# Patient Record
Sex: Male | Born: 1950 | Race: White | Hispanic: No | Marital: Single | Smoking: Current every day smoker
Health system: Southern US, Community
[De-identification: ages and names within clinical notes are randomized; demographics above are authoritative.]

## PROBLEM LIST (undated history)

## (undated) DIAGNOSIS — R569 Unspecified convulsions: Secondary | ICD-10-CM

## (undated) DIAGNOSIS — K219 Gastro-esophageal reflux disease without esophagitis: Secondary | ICD-10-CM

## (undated) HISTORY — PX: HERNIA REPAIR: SHX51

---

## 2012-03-07 ENCOUNTER — Inpatient Hospital Stay: Payer: Self-pay | Admitting: Unknown Physician Specialty

## 2012-03-07 LAB — URINALYSIS, COMPLETE
Bacteria: NONE SEEN
Glucose,UR: NEGATIVE mg/dL (ref 0–75)
Ketone: NEGATIVE
Leukocyte Esterase: NEGATIVE
Ph: 7 (ref 4.5–8.0)
RBC,UR: <1 /HPF (ref 0–5)
Specific Gravity: 1.003 (ref 1.003–1.030)

## 2012-03-07 LAB — ETHANOL
Ethanol %: 0.282 % — ABNORMAL HIGH (ref 0.000–0.080)
Ethanol: 282 mg/dL

## 2012-03-07 LAB — COMPREHENSIVE METABOLIC PANEL
Albumin: 4.2 g/dL (ref 3.4–5.0)
Anion Gap: 10 (ref 7–16)
BUN: 7 mg/dL (ref 7–18)
Chloride: 102 mmol/L (ref 98–107)
Co2: 27 mmol/L (ref 21–32)
EGFR (Non-African Amer.): >60
Osmolality: 276 (ref 275–301)
SGOT(AST): 171 U/L — ABNORMAL HIGH (ref 15–37)
SGPT (ALT): 104 U/L — ABNORMAL HIGH
Sodium: 139 mmol/L (ref 136–145)
Total Protein: 7.9 g/dL (ref 6.4–8.2)

## 2012-03-07 LAB — DRUG SCREEN, URINE
Amphetamines, Ur Screen: NEGATIVE (ref ?–1000)
Barbiturates, Ur Screen: NEGATIVE (ref ?–200)
MDMA (Ecstasy)Ur Screen: NEGATIVE (ref ?–500)
Methadone, Ur Screen: NEGATIVE (ref ?–300)
Opiate, Ur Screen: NEGATIVE (ref ?–300)
Phencyclidine (PCP) Ur S: NEGATIVE (ref ?–25)
Tricyclic, Ur Screen: NEGATIVE (ref ?–1000)

## 2012-03-07 LAB — CBC
MCHC: 35.1 g/dL (ref 32.0–36.0)
MCV: 97 fL (ref 80–100)
RBC: 4.34 10*6/uL — ABNORMAL LOW (ref 4.40–5.90)
WBC: 6.9 10*3/uL (ref 3.8–10.6)

## 2012-03-07 LAB — TSH: Thyroid Stimulating Horm: 3.11 u[IU]/mL

## 2012-03-31 ENCOUNTER — Inpatient Hospital Stay: Payer: Self-pay | Admitting: Internal Medicine

## 2012-03-31 LAB — CBC
HCT: 50 % (ref 40.0–52.0)
HGB: 17.2 g/dL (ref 13.0–18.0)
MCH: 32.3 pg (ref 26.0–34.0)
MCHC: 34.4 g/dL (ref 32.0–36.0)
MCV: 94 fL (ref 80–100)
RBC: 5.33 10*6/uL (ref 4.40–5.90)
RDW: 12.4 % (ref 11.5–14.5)
WBC: 11.9 10*3/uL — ABNORMAL HIGH (ref 3.8–10.6)

## 2012-03-31 LAB — COMPREHENSIVE METABOLIC PANEL
Albumin: 4.3 g/dL (ref 3.4–5.0)
Alkaline Phosphatase: 90 U/L (ref 50–136)
BUN: 19 mg/dL — ABNORMAL HIGH (ref 7–18)
Chloride: 102 mmol/L (ref 98–107)
Co2: 19 mmol/L — ABNORMAL LOW (ref 21–32)
Creatinine: 1.02 mg/dL (ref 0.60–1.30)
EGFR (African American): >60
Glucose: 103 mg/dL — ABNORMAL HIGH (ref 65–99)
Osmolality: 278 (ref 275–301)
SGOT(AST): 76 U/L — ABNORMAL HIGH (ref 15–37)
SGPT (ALT): 36 U/L
Sodium: 138 mmol/L (ref 136–145)
Total Protein: 8.2 g/dL (ref 6.4–8.2)

## 2012-03-31 LAB — ETHANOL: Ethanol %: 0.299 % — ABNORMAL HIGH (ref 0.000–0.080)

## 2012-03-31 LAB — URINALYSIS, COMPLETE
Glucose,UR: NEGATIVE mg/dL (ref 0–75)
Hyaline Cast: 1
Nitrite: NEGATIVE
Ph: 6 (ref 4.5–8.0)
Protein: 100
Specific Gravity: 1.026 (ref 1.003–1.030)
WBC UR: 1 /HPF (ref 0–5)

## 2012-03-31 LAB — DRUG SCREEN, URINE
Amphetamines, Ur Screen: NEGATIVE (ref ?–1000)
Cocaine Metabolite,Ur ~~LOC~~: NEGATIVE (ref ?–300)
Methadone, Ur Screen: NEGATIVE (ref ?–300)
Phencyclidine (PCP) Ur S: NEGATIVE (ref ?–25)

## 2012-03-31 LAB — TSH: Thyroid Stimulating Horm: 1.43 u[IU]/mL

## 2012-04-01 DIAGNOSIS — I4891 Unspecified atrial fibrillation: Secondary | ICD-10-CM

## 2012-04-01 LAB — BASIC METABOLIC PANEL
BUN: 20 mg/dL — ABNORMAL HIGH (ref 7–18)
Calcium, Total: 8.3 mg/dL — ABNORMAL LOW (ref 8.5–10.1)
Chloride: 101 mmol/L (ref 98–107)
EGFR (African American): >60
Osmolality: 277 (ref 275–301)
Potassium: 3.6 mmol/L (ref 3.5–5.1)
Sodium: 137 mmol/L (ref 136–145)

## 2012-04-01 LAB — LIPID PANEL
Cholesterol: 113 mg/dL (ref 0–200)
HDL Cholesterol: 41 mg/dL (ref 40–60)
Ldl Cholesterol, Calc: 24 mg/dL (ref 0–100)
Triglycerides: 241 mg/dL — ABNORMAL HIGH (ref 0–200)
VLDL Cholesterol, Calc: 48 mg/dL — ABNORMAL HIGH (ref 5–40)

## 2012-04-01 LAB — MAGNESIUM: Magnesium: 2 mg/dL

## 2012-04-02 ENCOUNTER — Telehealth: Payer: Self-pay

## 2012-04-02 DIAGNOSIS — I4891 Unspecified atrial fibrillation: Secondary | ICD-10-CM

## 2012-04-02 NOTE — Telephone Encounter (Signed)
LMTCB re: TCM hosp d/c f/u attempt

## 2012-04-02 NOTE — Telephone Encounter (Signed)
Message copied by Marcelle Overlie on Mon Apr 02, 2012  4:45 PM ------      Message from: [place] E      Created: Mon Apr 02, 2012  2:36 PM      Regarding: TRANSITIONAL CARE PT       NEEDS F/U 14 DAYS

## 2012-04-03 NOTE — Telephone Encounter (Signed)
LMTCB TCM attempt #2 

## 2012-04-10 LAB — ETHANOL: Ethanol %: 0.37 % (ref 0.000–0.080)

## 2012-04-10 LAB — URINALYSIS, COMPLETE
Blood: NEGATIVE
Glucose,UR: NEGATIVE mg/dL (ref 0–75)
Ketone: NEGATIVE
Ph: 7 (ref 4.5–8.0)
Protein: NEGATIVE
Squamous Epithelial: NONE SEEN

## 2012-04-10 LAB — COMPREHENSIVE METABOLIC PANEL
Calcium, Total: 8.6 mg/dL (ref 8.5–10.1)
Chloride: 106 mmol/L (ref 98–107)
Co2: 29 mmol/L (ref 21–32)
EGFR (African American): >60
EGFR (Non-African Amer.): >60
SGOT(AST): 60 U/L — ABNORMAL HIGH (ref 15–37)
SGPT (ALT): 31 U/L

## 2012-04-10 LAB — DRUG SCREEN, URINE
Amphetamines, Ur Screen: NEGATIVE (ref ?–1000)
Barbiturates, Ur Screen: NEGATIVE (ref ?–200)
Benzodiazepine, Ur Scrn: NEGATIVE (ref ?–200)
Cocaine Metabolite,Ur ~~LOC~~: NEGATIVE (ref ?–300)
Methadone, Ur Screen: NEGATIVE (ref ?–300)
Opiate, Ur Screen: NEGATIVE (ref ?–300)
Tricyclic, Ur Screen: NEGATIVE (ref ?–1000)

## 2012-04-10 LAB — CBC
HCT: 40 % (ref 40.0–52.0)
MCH: 32.8 pg (ref 26.0–34.0)
MCV: 95 fL (ref 80–100)
Platelet: 195 10*3/uL (ref 150–440)
RBC: 4.21 10*6/uL — ABNORMAL LOW (ref 4.40–5.90)
WBC: 6.8 10*3/uL (ref 3.8–10.6)

## 2012-04-10 LAB — LIPASE, BLOOD: Lipase: 593 U/L — ABNORMAL HIGH (ref 73–393)

## 2012-04-11 ENCOUNTER — Inpatient Hospital Stay: Payer: Self-pay | Admitting: Psychiatry

## 2012-04-11 LAB — ETHANOL
Ethanol %: 0.154 % — ABNORMAL HIGH (ref 0.000–0.080)
Ethanol: 154 mg/dL

## 2012-04-12 LAB — BEHAVIORAL MEDICINE 1 PANEL
Albumin: 3.5 g/dL (ref 3.4–5.0)
Anion Gap: 13 (ref 7–16)
BUN: 10 mg/dL (ref 7–18)
Basophil %: 0 %
Bilirubin,Total: 1.4 mg/dL — ABNORMAL HIGH (ref 0.2–1.0)
Calcium, Total: 8.4 mg/dL — ABNORMAL LOW (ref 8.5–10.1)
Creatinine: 0.65 mg/dL (ref 0.60–1.30)
Eosinophil #: 0.1 10*3/uL (ref 0.0–0.7)
Eosinophil %: 2.6 %
Glucose: 95 mg/dL (ref 65–99)
HCT: 36.1 % — ABNORMAL LOW (ref 40.0–52.0)
HGB: 12.5 g/dL — ABNORMAL LOW (ref 13.0–18.0)
Lymphocyte %: 40.8 %
MCH: 33.1 pg (ref 26.0–34.0)
Monocyte #: 0.4 x10 3/mm (ref 0.2–1.0)
Monocyte %: 8.7 %
Neutrophil #: 2.2 10*3/uL (ref 1.4–6.5)
Neutrophil %: 47.9 %
Potassium: 3.4 mmol/L — ABNORMAL LOW (ref 3.5–5.1)
RBC: 3.79 10*6/uL — ABNORMAL LOW (ref 4.40–5.90)
SGOT(AST): 46 U/L — ABNORMAL HIGH (ref 15–37)
SGPT (ALT): 23 U/L
Total Protein: 6.8 g/dL (ref 6.4–8.2)

## 2012-04-15 LAB — COMPREHENSIVE METABOLIC PANEL
Albumin: 3.7 g/dL (ref 3.4–5.0)
Alkaline Phosphatase: 88 U/L (ref 50–136)
BUN: 11 mg/dL (ref 7–18)
Bilirubin,Total: 0.4 mg/dL (ref 0.2–1.0)
Chloride: 105 mmol/L (ref 98–107)
Co2: 29 mmol/L (ref 21–32)
Creatinine: 0.91 mg/dL (ref 0.60–1.30)
Osmolality: 286 (ref 275–301)
Potassium: 3.6 mmol/L (ref 3.5–5.1)
Sodium: 142 mmol/L (ref 136–145)

## 2012-04-15 LAB — TSH: Thyroid Stimulating Horm: 4.72 u[IU]/mL — ABNORMAL HIGH

## 2012-04-17 LAB — T4, FREE: Free Thyroxine: 0.99 ng/dL (ref 0.76–1.46)

## 2012-04-25 ENCOUNTER — Inpatient Hospital Stay: Payer: Self-pay | Admitting: Psychiatry

## 2012-04-25 LAB — COMPREHENSIVE METABOLIC PANEL
Albumin: 4.3 g/dL (ref 3.4–5.0)
Anion Gap: 12 (ref 7–16)
Bilirubin,Total: 0.4 mg/dL (ref 0.2–1.0)
Chloride: 104 mmol/L (ref 98–107)
Co2: 24 mmol/L (ref 21–32)
Creatinine: 0.84 mg/dL (ref 0.60–1.30)
EGFR (African American): >60
EGFR (Non-African Amer.): >60
Osmolality: 281 (ref 275–301)
Potassium: 3.7 mmol/L (ref 3.5–5.1)
Sodium: 140 mmol/L (ref 136–145)

## 2012-04-25 LAB — URINALYSIS, COMPLETE
Bacteria: NONE SEEN
Blood: NEGATIVE
Ketone: NEGATIVE
Protein: NEGATIVE
Specific Gravity: 1.011 (ref 1.003–1.030)
WBC UR: NONE SEEN /HPF (ref 0–5)

## 2012-04-25 LAB — DRUG SCREEN, URINE
Amphetamines, Ur Screen: NEGATIVE (ref ?–1000)
Benzodiazepine, Ur Scrn: NEGATIVE (ref ?–200)
Methadone, Ur Screen: NEGATIVE (ref ?–300)
Phencyclidine (PCP) Ur S: NEGATIVE (ref ?–25)
Tricyclic, Ur Screen: NEGATIVE (ref ?–1000)

## 2012-04-25 LAB — CBC
HCT: 48.3 % (ref 40.0–52.0)
HGB: 16.4 g/dL (ref 13.0–18.0)
MCH: 32.2 pg (ref 26.0–34.0)
MCV: 95 fL (ref 80–100)
RBC: 5.08 10*6/uL (ref 4.40–5.90)
RDW: 13.1 % (ref 11.5–14.5)

## 2012-04-25 LAB — ETHANOL: Ethanol: 321 mg/dL

## 2012-04-25 LAB — TSH: Thyroid Stimulating Horm: 2.05 u[IU]/mL

## 2012-04-25 LAB — AMMONIA: Ammonia, Plasma: <25 mcmol/L (ref 11–32)

## 2012-08-20 LAB — CBC
HCT: 42.7 % (ref 40.0–52.0)
MCHC: 34.6 g/dL (ref 32.0–36.0)
MCV: 93 fL (ref 80–100)
RDW: 15.2 % — ABNORMAL HIGH (ref 11.5–14.5)

## 2012-08-20 LAB — DRUG SCREEN, URINE

## 2012-08-20 LAB — COMPREHENSIVE METABOLIC PANEL
Albumin: 3.9 g/dL (ref 3.4–5.0)
Anion Gap: 9 (ref 7–16)
BUN: 8 mg/dL (ref 7–18)
Bilirubin,Total: 0.4 mg/dL (ref 0.2–1.0)
Chloride: 105 mmol/L (ref 98–107)
EGFR (African American): >60
Glucose: 117 mg/dL — ABNORMAL HIGH (ref 65–99)
Osmolality: 277 (ref 275–301)
Potassium: 3.7 mmol/L (ref 3.5–5.1)
SGOT(AST): 67 U/L — ABNORMAL HIGH (ref 15–37)
Sodium: 139 mmol/L (ref 136–145)
Total Protein: 8.1 g/dL (ref 6.4–8.2)

## 2012-08-20 LAB — ETHANOL
Ethanol %: 0.34 % (ref 0.000–0.080)
Ethanol: 340 mg/dL

## 2012-08-20 LAB — URINALYSIS, COMPLETE
Nitrite: NEGATIVE
Protein: NEGATIVE
RBC,UR: 1 /HPF (ref 0–5)
Squamous Epithelial: NONE SEEN
WBC UR: 1 /HPF (ref 0–5)

## 2012-08-21 ENCOUNTER — Inpatient Hospital Stay: Payer: Self-pay | Admitting: Psychiatry

## 2013-04-15 IMAGING — CR DG CHEST 1V PORT
1 series · 2 of 2 positions shown · non-contrast
Comparison: none

REASON FOR EXAM: weakness
COMMENTS:

PROCEDURE:     DXR - DXR PORTABLE CHEST SINGLE VIEW  - April 10, 2012 [DATE]
RESULT:     Comparison: None

[Series 1: portable · 0.17mm/px · 2 of 2 slices shown]
[im 1/2]
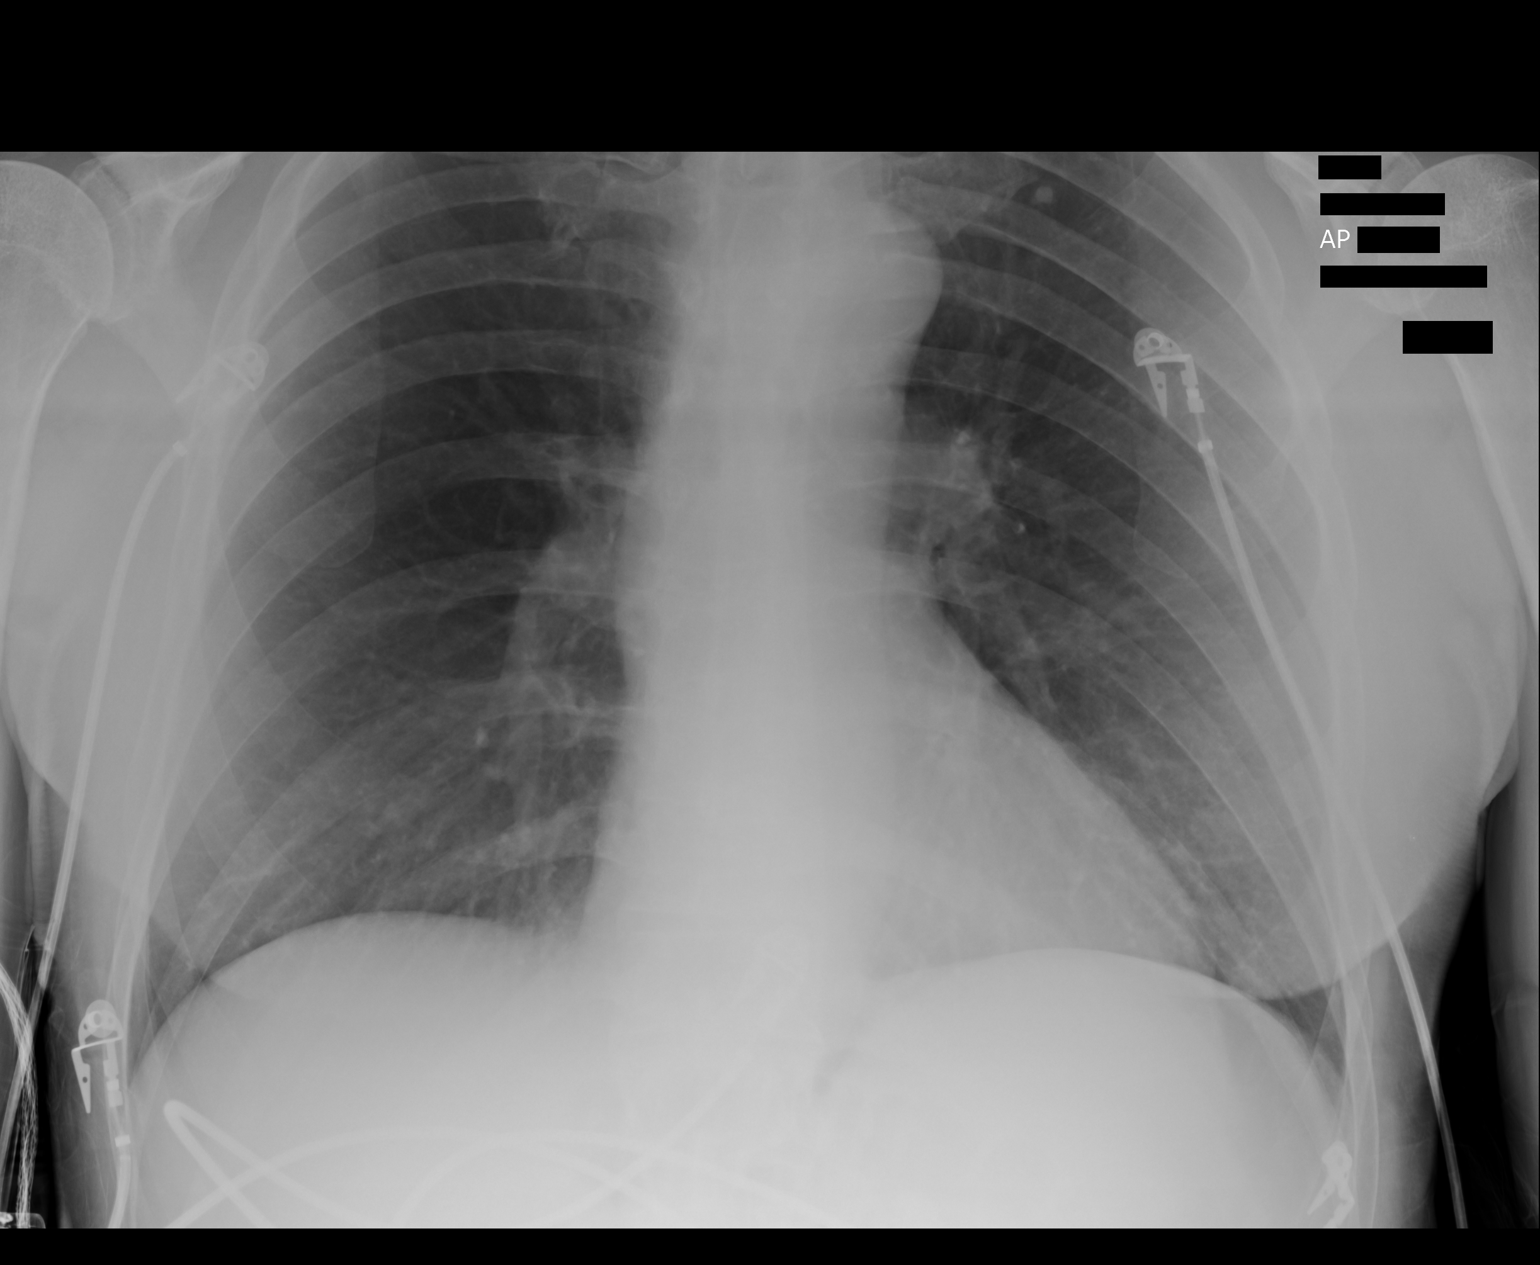
[im 2/2]
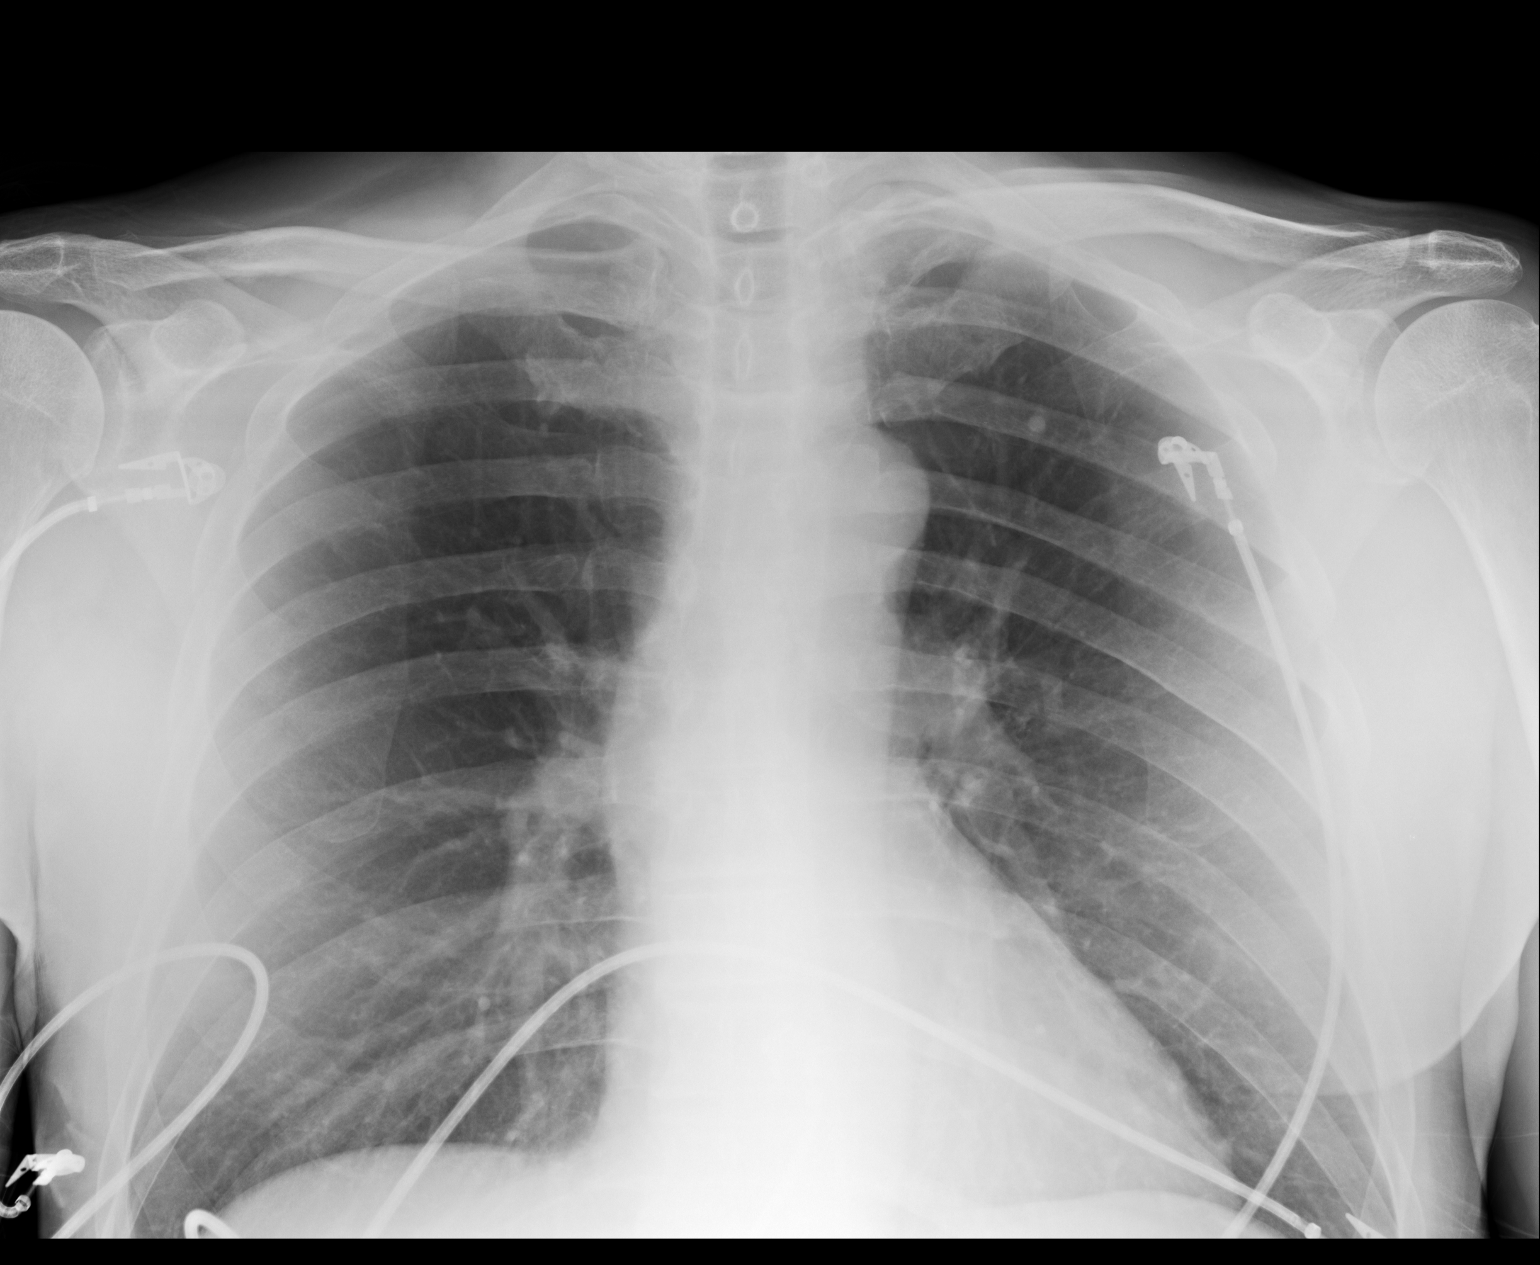

[2 of 2 positions shown; findings below may reference images not displayed]

FINDINGS: Single portable AP chest radiograph is provided.  There is no focal
parenchymal opacity, pleural effusion, or pneumothorax. Normal
cardiomediastinal silhouette. The osseous structures are unremarkable.
IMPRESSION: No acute disease of the che[REDACTED]

## 2013-04-15 IMAGING — CT CT HEAD WITHOUT CONTRAST
2 series · 16 of 30 positions shown, 20 images · non-contrast
Comparison: none

REASON FOR EXAM: ataxia
COMMENTS:

PROCEDURE:     CT  - CT HEAD WITHOUT CONTRAST  - April 10, 2012  [DATE]
RESULT:     History: Ataxia.
Comparison Study: No prior.

[Series 2: without · axial · non-contrast · 0.47mm/px · z∈[-148,-18]mm · 13 of 32 slices shown, 17 images]
[im 3/32  brain]
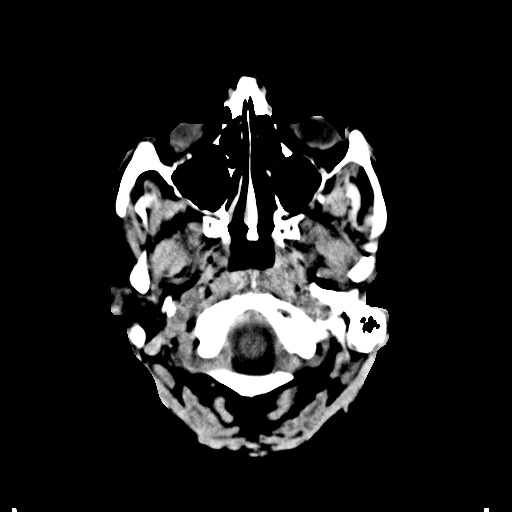
[im 3/32  bone]
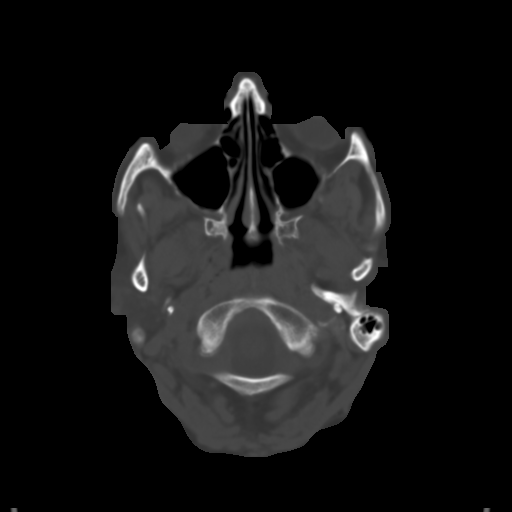
[im 5/32  brain]
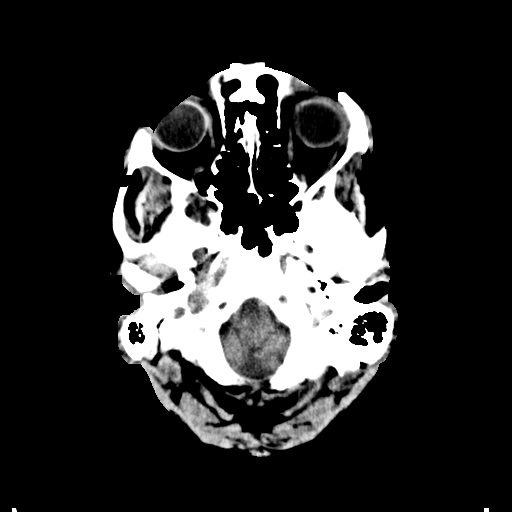
[im 7/32  brain]
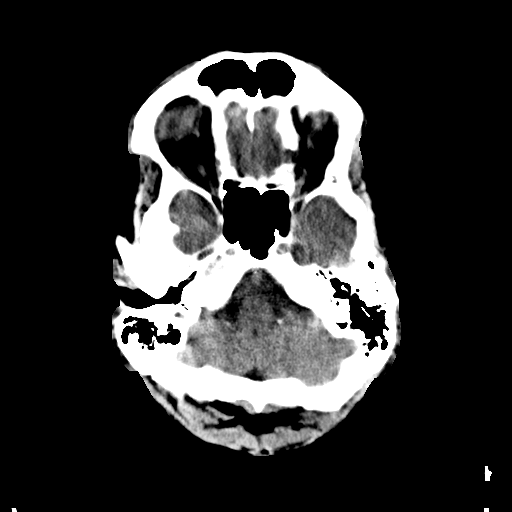
[im 9/32  brain]
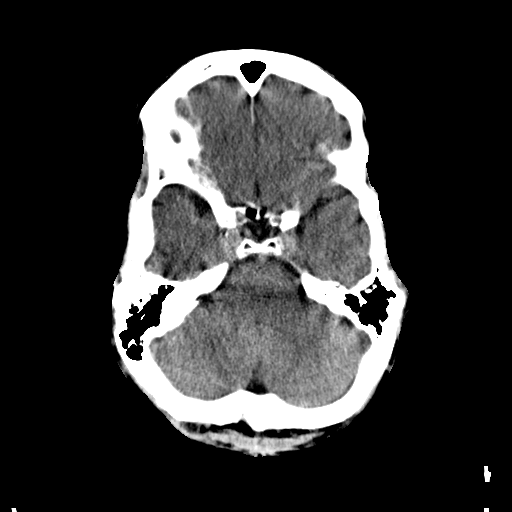
[im 12/32  brain]
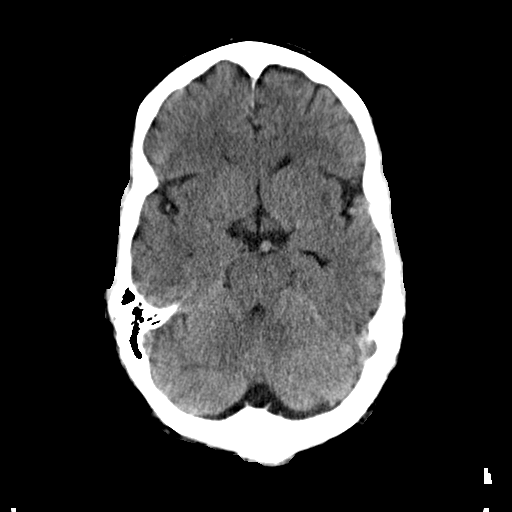
[im 12/32  bone]
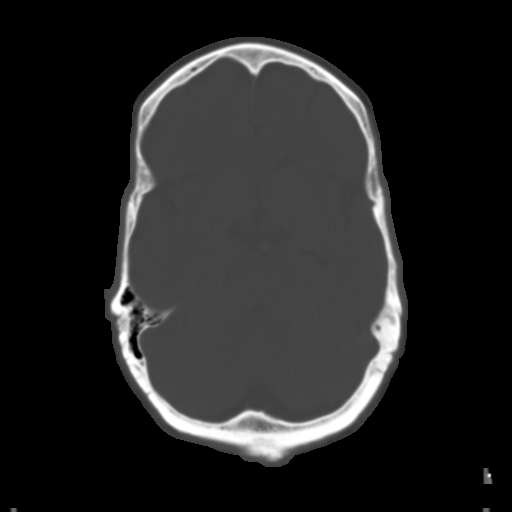
[im 14/32  brain]
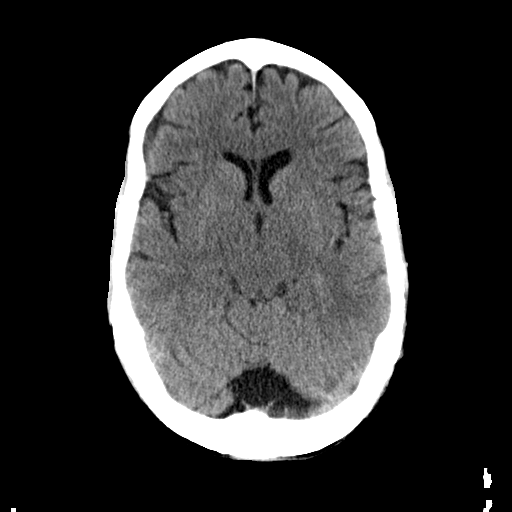
[im 16/32  brain]
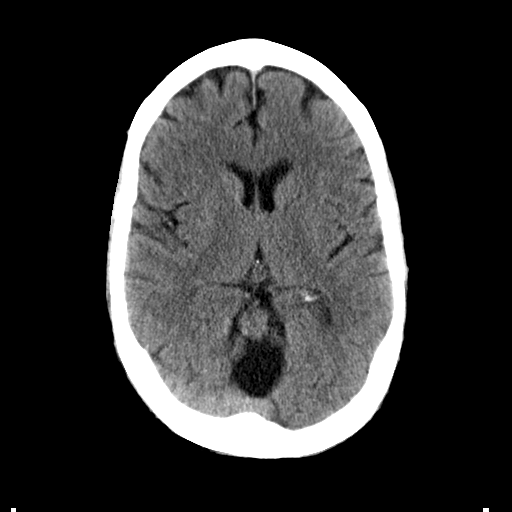
[im 18/32  brain]
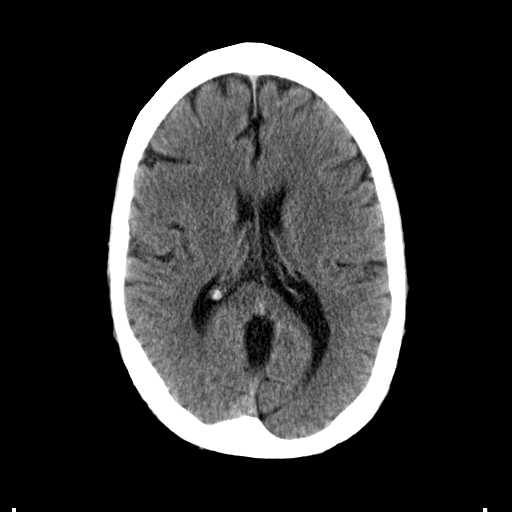
[im 20/32  brain]
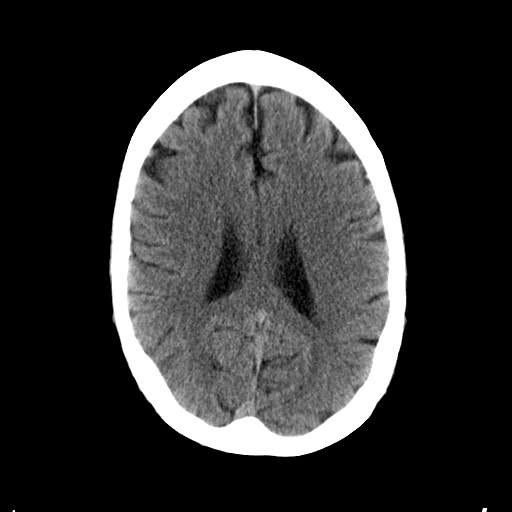
[im 20/32  bone]
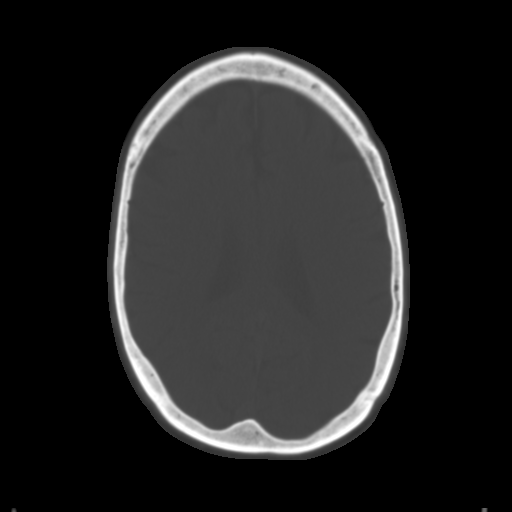
[im 23/32  brain]
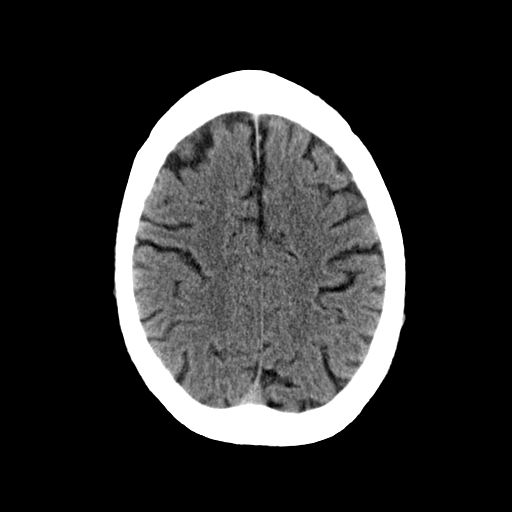
[im 25/32  brain]
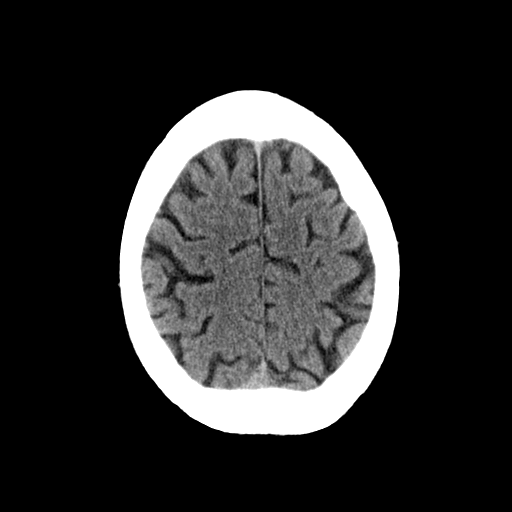
[im 27/32  brain]
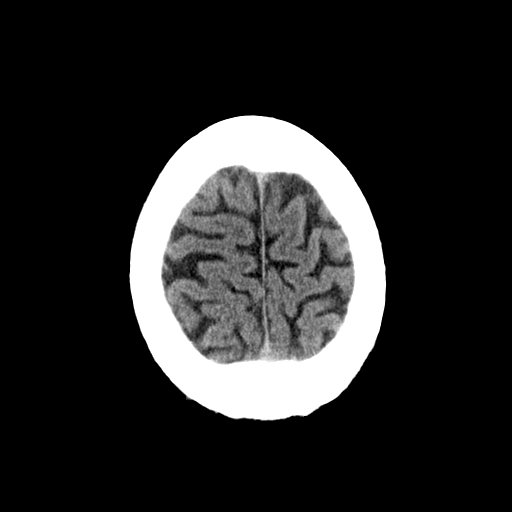
[im 29/32  brain]
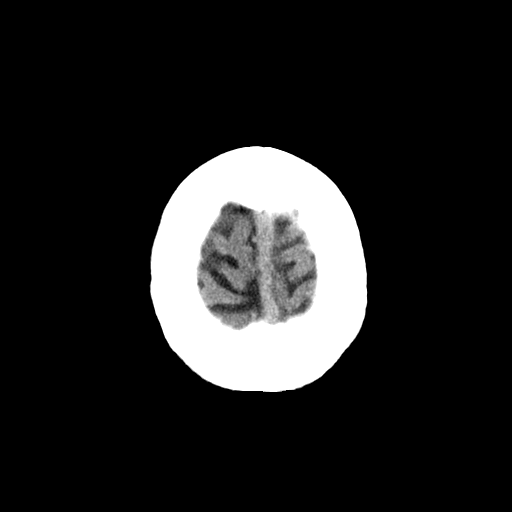
[im 29/32  bone]
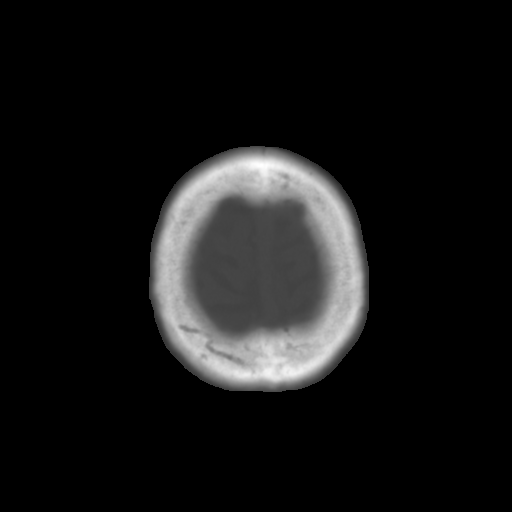

[Series 3: bone · axial · 0.47mm/px · z∈[-148,-103]mm · 3 of 32 slices shown]
[im 3/32  bone]
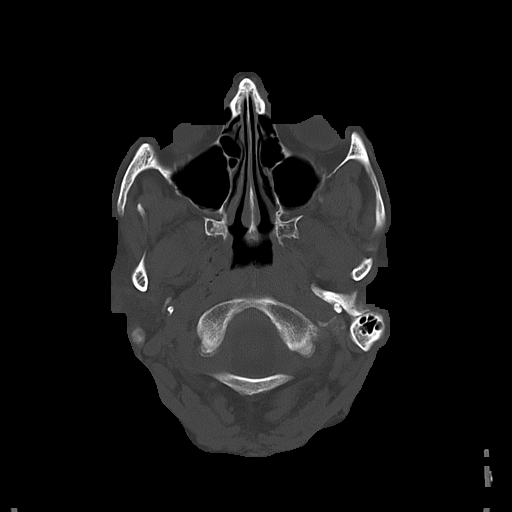
[im 7/32  bone]
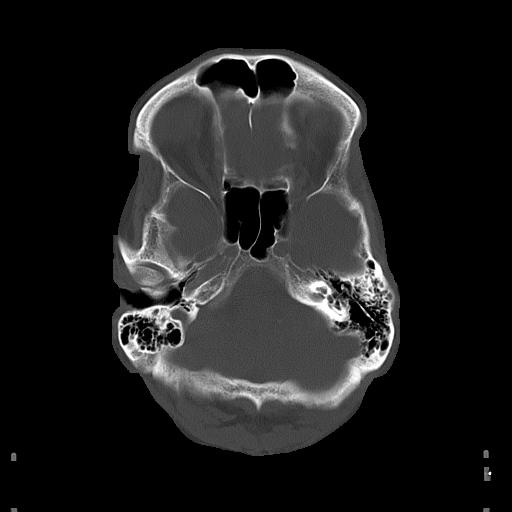
[im 12/32  bone]
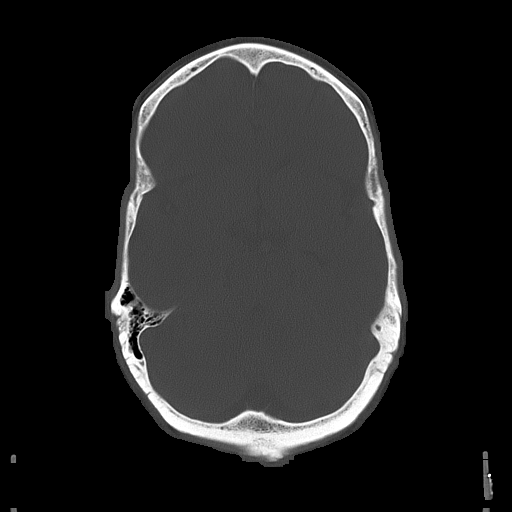

[16 of 30 positions shown; findings below may reference images not displayed]

FINDINGS: No mass. No hydrocephalus. Noted in the hemorrhage.
IMPRESSION: No acute abnormality. No acute bony abnormality.

## 2015-01-20 NOTE — H&P (Signed)
PATIENT NAME:  Jeffery Dominguez, Jeffery MR#:  [id number] DATE OF BIRTH:  [birth year]  DATE OF ADMISSION:  08/21/2012  REFERRING PHYSICIAN: Daryel NovemberJonathan Williams, MD  ATTENDING PHYSICIAN: Kristine LineaJolanta Yeimy Brabant, MD  IDENTIFYING DATA: Jeffery Dominguez is a 64 year old male with history of alcoholism.  CHIEF COMPLAINT: I need detox.  HISTORY OF PRESENT ILLNESS: Jeffery Dominguez was hospitalized at [facility] in July of 2013 for the same. He was detoxed from alcohol. He was unable to maintain any sobriety, relapsed right away. He has been drinking up to a fifth of vodka each day. He comes to the hospital for another detox.  In addition to alcohol addiction, the patient had a problem with narcotic pain killers. It started after dental extraction. He has been a patient of Dr. Victorino DikeJennifer Burn in [place] for almost two years who prescribed Suboxone 8 mg twice daily. I called the pharmacy. His last refill was done on 08/16/2012. He received 30 films which is 15-day supply. The patient endorses some symptoms of depression with poor sleep, decreased appetite, anhedonia, feeling of guilt, hopelessness, worthlessness, poor memory and concentration, fatigue, and social isolation that is related to his drinking. He denies psychotic symptoms. He denies other than alcohol substance use.  He is on Suboxone.   PAST PSYCHIATRIC HISTORY: He has been hospitalized several times in [place] as well as at [facility]. He denies suicide attempt. Hospitalizations were for alcohol detox.  He has not been in any residential substance abuse treatment program. He is taking Suboxone prescribed by Dr. Dorette GrateBurn. He has been tried on numerous medications, Prozac, Paxil, and Zoloft, but does not want to take any antidepressants.   FAMILY PSYCHIATRIC HISTORY: His father had a nervous breakdown.  PAST MEDICAL HISTORY:  1. Atrial fibrillation. 2. Hepatitis C. 3. Alcoholic pancreatitis. 4. Gastroesophageal reflux disease. 5. History  of alcohol withdrawal seizures.  ADMISSION MEDICATIONS: 1. Prilosec 20 mg daily. 2. Multivitamin daily. 3. Digoxin 250 mcg daily. 4. Metoprolol 25 mg twice daily. 5. Colace 100 mg twice daily. 6. Vistaril 50 mg at bedtime. 7. Suboxone 8 mg twice daily.   DRUG ALLERGIES: No known drug allergies.   SOCIAL HISTORY: He is employed at the [employer], works in Consulting civil engineerT. He has been in a relationship of almost a year.  He has children who stay in touch and still alive mother with whom he does not have a good relationship.   REVIEW OF SYSTEMS: CONSTITUTIONAL: No fevers or chills. Positive for gradual weight loss of 25 pounds in the past three months due to abdominal discomfort and poor diet. EYES: No double or blurred vision. ENT: No hearing loss. RESPIRATORY: No shortness of breath or cough. CARDIOVASCULAR: No chest pain or orthopnea. GASTROINTESTINAL: No abdominal pain, nausea, vomiting, or diarrhea. Normal bowel movements. GENITOURINARY: No incontinence or frequency. ENDOCRINE: No heat or cold intolerance. LYMPHATIC: No anemia or easy bruising. INTEGUMENTARY: No acne or rash. MUSCULOSKELETAL: No muscle or joint pain. NEUROLOGIC: No tingling or weakness. PSYCHIATRIC: See History of Present Illness for details.   PHYSICAL EXAMINATION:  VITALS: Blood pressure 121/73, pulse 123, respirations 20, and temperature 98.5.  GENERAL: This is a slender, well-developed male in no acute distress.   HEENT: The pupils are equally round and reactive to light. Sclerae anicteric.   NECK: Supple. No thyromegaly.   PULMONARY: Lungs are clear to auscultation. No dullness to percussion.  CARDIAC: Heart regular rhythm and rate. No murmurs, rubs or gallops.   ABDOMEN: Soft, nontender, and nondistended. Positive bowel  sounds.   MUSCULOSKELETAL: Normal muscle strength in all extremities.  SKIN: No rashes or bruises.   LYMPHATIC: No cervical adenopathy.  NEURO: Cranial nerves II through XII grossly intact.    LABORATORY DATA: Chemistries are within normal limits, except for blood glucose of 117. Blood alcohol level on admission 0.34. LFTs within normal limits, except for AST of 67. TSH 2.08. Urine drug screen negative for substances. CBC within normal limits.   Urinalysis is not suggestive of urinary tract infection.   MENTAL STATUS EXAMINATION ON ADMISSION: The patient is alert and oriented to person, place, time, and situation. He is pleasant, polite, and cooperative. He is well-groomed and casually dressed. He maintains good eye contact. His speech is soft. His mood is depressed with flat affect. Thought processing is logical and goal oriented. Thought content - he denies suicidal or homicidal  ideation. There are no delusions or paranoia. There are no auditory or visual hallucinations. His cognition is grossly intact. He registers three out of three and recalls three out of three objects after 5 minutes. He can spell world forward and backward. He knows three past presidents. His insight and judgment are questionable.   SUICIDE RISK ASSESSMENT ON ADMISSION: This is a patient with long history of alcoholism, but no suicide attempt or mood problems who came to the hospital for alcohol detox.   DIAGNOSES:  AXIS I:  1. Alcohol dependence. 2. Opiate dependence, on Suboxone.  AXIS II: Deferred.  AXIS III: Atrial fibrillation, hypertension, and constipation.  AXIS IV: Mental and physical illness, primary support, treatment compliance.  AXIS V: GAF on admission 35.   PLAN: The patient was admitted to [facility] Behavior Medicine unit for safety, stabilization, and medication management. He was initially placed on suicide precautions and was closely monitored for any unsafe behaviors. He underwent full psychiatric and risk assessment. He received pharmacotherapy, individual and group psychotherapy, substance abuse counseling, and support from therapeutic milieu.  1. Alcohol  detox: The patient was placed on standard CIWA protocol. He will be monitored for any symptoms of alcohol withdrawal.  2. Medical: We will continue all his medications as prescribed by his primary provider in the community. 3. Substance abuse treatment: The patient is not ready to do residential treatment. He will consider IOP in [place] closer to where he lives. 4. Disposition: To home when detox completed.  ____________________________ Ellin Goodie Jennet Maduro, MD jbp:[id number] D: 08/21/2012 17:26:00 ET T: 08/22/2012 07:39:46 ET JOB#: [id number]  cc: Elba Dendinger B. Jennet Maduro, MD, <Dictator> Shari Prows MD ELECTRONICALLY SIGNED 08/23/2012 8:12
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
## 2015-01-20 NOTE — Discharge Summary (Signed)
PATIENT NAME:  Jeffery Dominguez, [name]:  [id number] DATE OF BIRTH:  November 02, 1950  DATE OF ADMISSION:  04/11/2012 DATE OF DISCHARGE:  04/17/2012  HISTORY OF PRESENT ILLNESS: Mr. Jeffery Dominguez is a 64 year old male presenting to the Emergency Department with depression and alcohol dependence. His blood alcohol level even by the time he was tested in the Emergency Department was still 370. He had been recently discharged from the inpatient behavioral health unit and had only experienced approximately three days of sobriety. He then went back to heavy drinking for approximately three to four weeks. He has also been experiencing excessive worry and much work stress with depressed mood and difficulty concentrating.   His memory and orientation function were grossly intact. However, he did have some clouding of consciousness and it was clear after 1 to 2 days of admission, that he did have some partial recent memory ongoing difficulty.   ANCILLARY CLINICAL DATA: None.   HOSPITAL COURSE: Mr. Jeffery Dominguez was admitted to the inpatient behavioral health unit and underwent milieu and group psychotherapy.   See the above regarding his memory. This difficulty with recent recall did resolve. His behavior in group and the milieu was socially normal. His depressed mood resolved. His concentration became normal.   He underwent the Ativan CIWA detox protocol which was then converted over to a standing taper with a p.r.n. available for temperature greater than 100, diastolic blood pressure greater than 100, pulse greater than 110. He tolerated this taper well.   CONDITION ON DISCHARGE: By the day of discharge, Mr. Jeffery Dominguez is socially appropriate. His mood and interests are normal. He has constructive future goals. He is not experiencing any withdrawal symptoms. He expresses motivation for ongoing alcohol prevention tools including the 12-step method.   However, he states that he cannot attend a residential chemical dependence rehab program due to  his work obligations.   Mr. Jeffery Dominguez was kept on his Suboxone 8 milligrams b.i.d. for opioid dependence without change and he exhibited no opioid withdrawal.   MENTAL STATUS EXAM UPON DISCHARGE: Mr. Jeffery Dominguez is alert. Eye contact is good. He is oriented to all spheres. Memory is intact to immediate, recent, and remote. Fund of knowledge, intelligence, and use of language normal. Abstraction is normal. Speech involves regular rate and rhythm. Normal prosody. No dysarthria. Thought process is logical, coherent, and goal directed. No persistent associations or tangents. Thought content: No thoughts of harming himself or others. No delusions or hallucinations. Insight is intact. Judgment is intact. Mood within normal limits. Affect broad and appropriate.   DISCHARGE DIAGNOSES:  AXIS I:  1. Alcohol dependence.  2. Opioid dependence. 3. Anxiety disorder, not otherwise specified.   AXIS II: Deferred.   AXIS III: Gastroesophageal reflux disease.   AXIS IV: Occupational.   AXIS V: 55.   Mr. Jeffery Dominguez is not at risk to harm himself or others. He agrees to call emergency services immediately for any thoughts of harming himself, thoughts of harming others, or distress.   He agrees to not drive if drowsy.   DISCHARGE MEDICATIONS: He will continue on his baseline Docusate Sodium 100 mg b.i.d., polyethylene glycol powder 17 grams daily, hydroxyzine was added 50 milligrams at bedtime for insomnia which was effective, Suboxone 8/2 mg b.i.d.   Twelve-step method, groups and sponsors.   Other follow-up with Dr[facility] Beverely LowJeannie Burn, [facility] 07/18 at 4:30.   DIET: Regular.   ACTIVITY: Routine.   ____________________________ Adelene AmasJames S. Stephanieann Popescu, MD jsw:ap D: 04/23/2012 11:04:59 ET T: 04/23/2012 12:17:22  ET JOB#: [id number]  cc: Adelene Amas. Marquist Binstock, MD, <Dictator> Lester Shepherd MD ELECTRONICALLY SIGNED 04/24/2012 23:34

## 2015-01-20 NOTE — Consult Note (Signed)
PATIENT NAME:  Jeffery Dominguez, Jeffery Dominguez MR#:  [id number] DATE OF BIRTH:  1950/10/14  DATE OF CONSULTATION:  04/26/2012  REFERRING PHYSICIAN:  Dr. Maryruth Bun  CONSULTING PHYSICIAN:  Carney Corners. Rudene Re, MD  PRIMARY CARE PHYSICIAN: He has no local doctor.   REASON FOR CONSULTATION: Atrial fibrillation and atrial flutter.   HISTORY OF PRESENT ILLNESS: Jeffery Dominguez is a 64 year old chronic alcoholic Caucasian male with several admissions for alcohol detoxification. The patient is also known to have chronic atrial fibrillation. Last admission 03/31/2012 he presented with again alcoholism and this was associated with atrial fibrillation and rapid ventricular rate. He had adequate work-up at that time including echocardiogram which showed normal ejection fraction at 55% and normal right ventricular systolic function. No evidence of pulmonary hypertension. The patient was seen by cardiology and evaluated by Dr. Kirke Corin. He recommended adding beta blockers and digoxin and possibly to consider anticoagulation with Xarelto. Patient's TSH was checked at that time and was normal. Currently the patient is back drinking alcohol, primarily vodka. As far as cardiovascular complaints he has no palpitations. No syncope or near syncope. His only problem is that he has difficulty for focus with this level of alcohol.    REVIEW OF SYSTEMS: CONSTITUTIONAL: Denies any fever. No chills. No fatigue. EYES: No blurring of vision or double vision although he has difficulty to focus. ENT: No hearing impairment. No sore throat. No dysphagia. CARDIOVASCULAR: No chest pain. No shortness of breath. No syncope. No palpitations. No edema. RESPIRATORY: No cough. No sputum production. No chest pain. No shortness of breath. GASTROINTESTINAL: No abdominal pain. No vomiting. No diarrhea. GENITOURINARY: No dysuria. No frequency of urination. MUSCULOSKELETAL: No joint pain or swelling. No muscular pain or swelling. INTEGUMENTARY: No skin rash. No ulcers. NEUROLOGY: No focal  weakness. No seizure activity. He appears slightly confused and difficult to focus and difficulty to understand the question, has to think about it. PSYCHIATRY: No anxiety or depression. ENDOCRINE: No polyuria or polydipsia. No heat or cold intolerance.   PAST MEDICAL HISTORY:  1. Chronic alcoholism. 2. Atrial fibrillation. 3. Alcoholic pancreatitis.  4. Tobacco abuse.  5. History of narcotic dependence as well.   SOCIAL HABITS: Chronic smoker 1/2 pack a day. Chronic alcoholic. States that he drinks vodka half of a fifth a day.   SOCIAL HISTORY: He is back living with his girlfriend again. 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   FAMILY HISTORY: His mother is still alive and she suffers from osteoarthritis. His father died when he was killed by motor vehicle accident. He was hit by a truck.   ADMISSION MEDICATIONS:  1. Prilosec 20 mg once a day.  2. Suboxone 8/2 mg 2 times a day.  3. Vistaril 50 mg once a day.   ALLERGIES: No known drug allergies.   PHYSICAL EXAMINATION:  VITAL SIGNS: Blood pressure 109/72, respiratory rate 16, pulse 103, temperature 97.4, oxygen saturation 96%.   GENERAL APPEARANCE: Middle-aged male laying in bed in no acute distress.   HEAD AND NECK EXAMINATION: No pallor. No icterus. No cyanosis.   ENT: Hearing was normal. Nasal mucosa, lips, tongue were normal.   EYES: Normal eyelids and conjunctiva. Pupils about 4 mm, equal and reactive to light.   NECK: Supple. Trachea at midline. No thyromegaly. No cervical lymphadenopathy. No masses.   HEART: Regular S1, S2. No S3, S4. No murmur. No gallop. No carotid bruits.   RESPIRATORY: Normal breathing pattern without use of accessory muscles. No rales. No wheezing.   ABDOMEN: Soft  without tenderness. No hepatosplenomegaly. No masses. No hernias.   SKIN: No ulcers. No subcutaneous nodules.   MUSCULOSKELETAL: No joint swelling. There is mild clubbing of fingers.   NEUROLOGIC: Cranial  nerves II through XII are intact. No focal motor deficit. He has mild tremors of hands.   PSYCHIATRIC: Patient is alert, oriented to place and people. He has difficulty to focus. Mood and affect were flat.   LABORATORY, DIAGNOSTIC, AND RADIOLOGICAL DATA: Serum glucose 127, BUN 12, creatinine 0.8, sodium 140, potassium 3.7, alcohol level 0.321. Liver function tests were normal except for a slight elevation of AST. TSH was normal at 2.05. Drug screen was negative. CBC showed white count 8000, hemoglobin 16, hematocrit 48, platelet count 298. Urinalysis was unremarkable.   ASSESSMENT:  1. Alcohol intoxication and chronic alcoholism.  2. Atrial fibrillation and flutter with rapid ventricular rate.  3. Altered mental status secondary to alcohol intoxication.  4. Chronic smoking.  5. Noncompliance.   PLAN: Recommend placing the patient on small dose of beta blocker using metoprolol 25 mg twice a day along with digoxin 0.25 mg once a day. Even though it was advised in the past that anticoagulation is thought to be considered, however, it is contraindicated in his case given his habit of chronic alcoholism and also noncompliance rendering the decision of anticoagulation to be hazardous.   I thank Dr. Maryruth BunKapur for asking me to participate in his care.   TIME SPENT: Evaluating this patient and reviewing his medical records took more than 55 minutes.  ____________________________ Carney CornersAmir M. Rudene Rearwish, MD amd:cms D: 04/26/2012 01:22:56 ET T: 04/26/2012 10:06:04 ET  JOB#: [id number] cc: Carney CornersAmir M. Rudene Rearwish, MD, <Dictator> Zollie ScaleAMIR M Burlie Cajamarca MD ELECTRONICALLY SIGNED 04/27/2012 22:34

## 2015-01-25 NOTE — Consult Note (Signed)
Patient seen at request of Attending MD Dr. Alycia Rossettiyan in order to explore plans at discharge for outpatient treatment thru the services of the [facility] CD[facility]. He was able to report events/ reason for this admission as well plans to remain abstinence thru the assistance of AA. He was able to report present intimate relationship distress  and possibility of girlfriend toward her personal recovery and the possibility of her participating in some type of treatment such as the CD-IOP and that at this time he plans to "stick" with what has worked for him in the past which has been AA. was oriented to the CD-IOP. Assigned nurse informed of patient's plans at this inpatient discharge.   Electronic Signatures: Huel Cotehomas, Richard (PsyD)  (Signed on 10-Jun-13 14:26)  Authored  Last Updated: 10-Jun-13 14:26 by Huel Cotehomas, Richard (PsyD)

## 2015-01-25 NOTE — Discharge Summary (Signed)
PATIENT NAME:  Jeffery Dominguez, Jeffery Dominguez MR#:  [id number] DATE OF BIRTH:  05-24-1951  DATE OF ADMISSION:  03/07/2012 DATE OF DISCHARGE:  03/13/2012  HISTORY OF PRESENT ILLNESS: The patient was admitted with alcohol dependence. For further details, please see typed history and physical.   ACCESSORY CLINICAL DATA: TSH 3.11 within normal limits. Ethanol blood level was 0.282 on admission. Liver function tests within normal limits except for slightly elevated SGOT at 171, SGPT 104. Calcium 8.3, slightly low. Glucose 102, slightly elevated. Creatinine, electrolytes, BUN within normal limits. CBC within normal limits. Drug screen is negative. Urinalysis is within normal limits.   HOSPITAL COURSE: The patient was continued on Suboxone for treatment of narcotics. The patient was placed on CIWA for alcohol withdrawal. At times he did require up to 16 mg per day and needed to go a day or two longer than normal because of his severe history of DTs in the past. However, at the time of discharge, he was doing much better. He agreed to go back to AA. I did add Vistaril for sleep which did help. At the time of discharge he would attend an AA meeting on the same day and return to work in about 4 to 5 days.   DIAGNOSES:  AXIS I:  1. Alcohol dependence.  2. Narcotic dependence.   AXIS III:  1. Alcohol withdrawal state. 2. Gastroesophageal reflux disease.    CONDITION ON DISCHARGE: Good.   DISPOSITION: The patient will return to AA, return to his medical doctor in [place].   DISCHARGE MEDICATIONS:  1. Omeprazole 20 mg daily. 2. Suboxone 8 mg b.i.d.  3. Vistaril 25/50 at bedtime p.r.n. sleep.   DIET AND ACTIVITY: As tolerated.   ____________________________ Venida JarvisWilliam James Ryan II, MD wjr:ap D: 03/12/2012 14:07:23 ET T: 03/12/2012 15:01:31 ET JOB#: [id number]  cc: Venida JarvisWilliam James Ryan II, MD, <Dictator> Jules HusbandsWILLIAM J RYAN MD ELECTRONICALLY SIGNED 03/12/2012 15:50

## 2015-01-25 NOTE — H&P (Signed)
PATIENT NAME:  Jeffery Dominguez, Jeffery Dominguez MR#:  [id number] DATE OF BIRTH:  1951-01-20  DATE OF ADMISSION:  04/11/2012  CHIEF COMPLAINT/IDENTIFYING DATA: Mr. Jeffery Dominguez is a 64 year old male presenting with depression and alcohol dependence.   HISTORY OF PRESENT ILLNESS: Mr. Jeffery Dominguez has a blood alcohol level of 370 even after he has spent some time in the Emergency Department. He had very few days of sobriety after discharge approximately one month ago and then went back to extremely heavy drinking. He site stress with his girlfriend as precipitating. He has been experiencing over two weeks of mildly depressed mood; however, his greatest difficulty besides alcohol dependence and excessive worry, he has trouble letting go of his concerns.   He is not having any thoughts of harming himself or others. He has no hallucinations or delusions. His orientation and memory function are intact. His thought process is organized, although at times he has displayed some disorganization in the Emergency Room along with clouding  of consciousness.  He also has exhibited approximately three hours of tachycardia and sweat after the discontinuation of alcohol. He has been drinking an unknown amount of alcohol per day, but the pattern is similar to his previous  alcohol dependence.   PAST PSYCHIATRIC HISTORY:  Mr. Jeffery Dominguez was admitted to the [facility] on the 5th of June this year with alcohol dependence. His longest period of sobriety has been four years. He does have a history of blackouts, seizures. He also has gone through alcohol withdrawal delirium in the past. He underwent an Ativan detox during his last admission.    FAMILY PSYCHIATRIC HISTORY: Negative.   SOCIAL HISTORY: Mr. Jeffery Dominguez has had a break-up with his girlfriend as described above.  He was lifting weights and taking care of himself until going back on an alcohol dependent pattern.   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 He  has does not use any illegal drugs. He smokes 1/2 pack of cigarettes a day. Children: Two adult.   PAST MEDICAL HISTORY:  1. Gastroesophageal reflux disease. He has been taking Omeprazole 20 mg daily for his gastroesophageal reflux disease.  2. He also take Suboxone on a regular basis and has a history of narcotic dependence. The Suboxone dosage is 8 mg b.i.d.  3. He has been taking Vistaril 25 to 50 mg at bedtime p.r.n. insomnia.   ALLERGIES: No known drug allergies.   LABORATORY DATA: SGOT elevated at 60. Ethanol 370. Chemistry panel unremarkable. Urine drug screen negative. Urinalysis unremarkable. WBC and the rest if the CBC are unremarkable.   REVIEW OF SYSTEMS: Constitutional, HEENT, mouth, neurologic, psychiatric, cardiovascular, respiratory, gastrointestinal, genitourinary, skin, musculoskeletal, hematologic, lymphatic, endocrine, and metabolic are all unremarkable.   PHYSICAL EXAMINATION:  VITAL SIGNS: Temperature 97.8, pulse 61, respiratory rate 18, blood pressure 92/54.   GENERAL APPEARANCE: Mr. Jeffery Dominguez is a well-developed, well-nourished middle-aged male lying in a supine position in his hospital bed with no abnormal involuntary movements other than typical diffuse alcohol tremor. He has no cachexia. Muscle tone is normal. His grooming and hygiene involve normal hygiene but disheveled grooming.   HEENT: Head normocephalic, atraumatic. Pupils are equally round and reactive to light and accommodation. Oropharynx is clear without erythema.   EXTREMITIES: No cyanosis, clubbing, or edema.   SKIN: Normal turgor. No rashes.   NECK: Supple, nontender. No masses.   LUNGS: Clear to auscultation. No wheezing, rhonchi, or rales.   CARDIOVASCULAR: Regular rate and rhythm. No murmurs, rubs, or gallops.  ABDOMEN: Nondistended. Bowel sounds positive. Soft, nontender, no masses.   GENITOURINARY: Deferred.   NEUROLOGICAL: Cranial Nerves II through XII intact. General sensory intact  throughout to light touch. Motor 5/5 strength throughout. Coordination intact by finger-to-nose bilaterally. Deep tendon reflexes normal strength and symmetry throughout. No Babinski.   MENTAL STATUS EXAMINATION: Mr. Jeffery Dominguez has a slight clouding of sensorium. His eye contact is slightly intermittent. He is oriented to all spheres. Memory is intact to immediate, recent, and remote. Speech involves normal rate and prosody without dysarthria. Thought process involves occasional disorganization but 90% logical, coherent, and goal directed. No tangents. Thought content: No thoughts of harming himself or others. No delusions or hallucinations. Affect is anxious. Mood is mildly anxious. Insight is partial. Judgment is impaired for self care outside the hospital.   ASSESSMENT:  AXIS I:  1. Anxiety disorder, not otherwise specified. He may have a diagnosis of generalized anxiety disorder.  2. Alcohol dependence with physiologic dependence.  3. Slight alcohol withdrawal delirium which appears to be clearing.   AXIS II: Deferred.   AXIS III: Gastroesophageal reflux disease.  AXIS IV: Primary support group.   AXIS V: 30.   Mr. Jeffery Dominguez would be at risk for deadly alcohol withdrawal. He also has impaired judgment which could lead to self neglect outside the hospital.   PLAN:  1. Therefore, we will admit Mr. Jeffery Dominguez to the Inpatient Behavioral Health Unit.  2. He will undergo an Ativan detox protocol.  3. He  will undergo thiamine supplementation.  4. When he becomes physically able, he will  participate in milieu and group psychotherapy.  5. He will be evaluated for the need of an selective serotonin reuptake inhibitor for anxiety.     6. The recommendation for chemical dependence residential rehabilitation will be made.  7. A 12-step method recommended is recommended.   ____________________________ Adelene AmasJames S. Surie Suchocki, MD jsw:cbb D: 04/12/2012 11:35:15 ET T: 04/12/2012 12:23:57  ET JOB#: [id number]  cc: Adelene AmasJames S. Carra Brindley, MD, <Dictator> Lester CarolinaJAMES S Dannie Hattabaugh MD ELECTRONICALLY SIGNED 04/24/2012 23:31

## 2015-01-25 NOTE — Consult Note (Signed)
Brief Consult Note: Diagnosis: paroxysmal atrial fibrillation, ETOH abuse.   Patient was seen by consultant.   Consult note dictated.   Orders entered.   Comments: Check echocardiogram.  Change Cardizem to Metoprolol and add Digoxin.  His CHADS score is 0, thus no need for anticoagulation. However, will keep him on Lovenox for now in case cardioversion is needed if unable to control rate.  Electronic Signatures: Lorine BearsArida, Muhammad (MD)  (Signed 30-Jun-13 12:33)  Authored: Brief Consult Note   Last Updated: 30-Jun-13 12:33 by Lorine BearsArida, Muhammad (MD)

## 2015-01-25 NOTE — H&P (Signed)
PATIENT NAME:  Jeffery Dominguez, Jeffery Dominguez MR#:  [id number] DATE OF BIRTH:  [birth year]  DATE OF ADMISSION:  03/07/2012  CHIEF COMPLAINT: Drinking.   HISTORY OF PRESENT ILLNESS: This is one of several psychiatric hospitalizations, the first here, for this 64 year old white separated male who is admitted from the Emergency Room. The patient has a long-term alcohol history. He first began drinking at the age of 71, first began to be a problem at the age of 71. He was sober for four years until four months ago, and over the last four months has been drinking up to a fifth of whiskey per day, occasionally begins in the morning, does have blackouts. No violence while drinking. He has a history of DTs in the past and has a history of alcohol withdrawal seizures in the past. Last drink was about one hour from the time I saw him.   The patient also has a history of narcotic dependence. He has been on Suboxone 8 b.i.d. for the last year and a half, which has controlled the narcotic dependence. He has no other drug abuse. He has some anxiety and depression associated with drinking. He does report to me that he thinks he started drinking because of conflict with the girlfriend with whom he has been living for the last two years.   PAST MEDICAL HISTORY: Gastroesophageal reflux disease.  MEDICATIONS: 1. Omeprazole 20 mg once a day. 2. Suboxone 8 mg b.i.d.  3. Benadryl 25 mg at bedtime as needed for sleep.   REVIEW OF SYSTEMS: He has slight decrease in hearing. Otherwise currently is negative.   FAMILY HISTORY: According to the patient is negative.   SOCIAL HISTORY: The patient lives in [place] with his girlfriend. He does have two adult children whom he sees occasionally. He works in Consulting civil engineer for [employer] and describes himself when not drinking, etc., generally as a Office manager. He smokes half a packs of cigarettes per day. He does not use any other drugs. When well he enjoys working out including [employer] and running.   MENTAL  STATUS: On admission revealed a white male who looked his stated age. He was cooperative and coherent and able to give me a fairly good history. Affect did not appear to be depressed or anxious at this time and he did not appear to be intoxicated even though he had a drink an hour and a half ago. He was well groomed and aware of his surroundings, oriented times four, knew the presidents backwards times two, remembered three of three objects at one and three minutes.   PHYSICAL EXAMINATION: HEENT: Head is normocephalic. Pupils equal, round, and reactive to light and accommodation. Throat is clear.   NECK: Supple without masses.   CHEST: Clear.   CARDIAC: Normal sinus rhythm with normal S1, S2 without murmur or gallop. Good carotid and pedal pulses.   ABDOMEN: Soft with slight right upper quadrant tenderness. I did not feel the liver, however. No rebound. Bowel sounds are present. No flank tenderness.   EXTREMITIES: No limitation of motion.   NEUROLOGICAL: Deep tendon reflexes are trace to 1+ and symmetrical with no pathological reflexes.   IMPRESSION:  AXIS I:  1. Alcohol dependence.  2. Narcotic dependence.   AXIS II: None.   AXIS III:  1. Alcohol withdrawal state.  2. Gastroesophageal reflux disease.   AXIS IV: Conflict with girlfriend.   AXIS V: 35. The patient is addicted to alcohol and needs withdrawal with a past history of DTs and seizures.  ASSESSMENT AND PLAN: The patient will be placed on CIWA protocol and observed closely.    ____________________________ Venida JarvisWilliam James Finn Altemose II, MD wjr:bjt D: 03/07/2012 12:33:31 ET T: 03/07/2012 13:59:41 ET JOB#: [id number]  cc: Venida JarvisWilliam James Kimyah Frein II, MD, <Dictator> Jules HusbandsWILLIAM J Jamae Tison MD ELECTRONICALLY SIGNED 03/09/2012 14:17

## 2015-01-25 NOTE — Discharge Summary (Signed)
PATIENT NAME:  Jeffery Dominguez, [name] MR#:  [id number] DATE OF BIRTH:  Apr 12, 1951  DATE OF ADMISSION:  03/31/2012 DATE OF DISCHARGE:  04/02/2012  The patient signed out AGAINST MEDICAL ADVICE on 04/02/2012.   DISCHARGE DIAGNOSES:  1. Atrial fibrillation with rapid ventricular response. 2. Mild pancreatitis.  3. Leukocytosis.  4. Anion gap acidosis.  5. Elevated AST due to alcohol abuse.  6. Alcohol abuse/narcotic dependence/smoking.   DISPOSITION: The patient signed out AGAINST MEDICAL ADVICE.   CONSULTATION: Cardiology consultations with Dr. Kirke CorinArida and Dr. Mariah MillingGollan.  ECHOCARDIOGRAM:  Technically difficult study, ejection fraction more than 55%, normal  right ventricular systolic function. No evidence of pulmonary hypertension.   LABORATORY DATA: CBC normal other than white count 11.9, glucose 113, BUN 19, creatinine 1.02, sodium 138, potassium 3.8, CO2 19, anion gap 17, lipase 687/764. Alcohol level 0.299. Elevated AST. TSH normal. Urine drug screen negative.   HOSPITAL COURSE: The patient is a 64 year old male with past medical history of alcohol and smoking who presented to the ED for alcohol detoxification. He was admitted to the medical service because he was found to be in atrial fibrillation with RVR. The patient reported that he had paroxysmal atrial fibrillation for four years and was not on any treatment or anticoagulation. He was evaluated by Dr. Kirke CorinArida in the hospital. His echo was normal as was his TSH.  Dr. Kirke CorinArida recommended Lopressor and digoxin for heart rate control and possibly Xarelto if cardioversion was needed. The patient also had mild alcohol-induced pancreatitis with mildly elevated triglycerides. He was started on a clear liquid diet. He had mild leukocytosis and anion gap acidosis.  The acidosis resolved with IV fluid hydration. He had mildly elevated AST likely due to alcohol abuse. He was counseled about smoking cessation. He was treated with CIWA protocol for alcohol abuse.  Since  04/01/2012 the patient wanted to leave AGAINST MEDICAL ADVICE. He was aggressively counseled. However, he finally signed himself out AGAINST MEDICAL ADVICE on 04/02/2012.   TIME SPENT: 45 minutes.   ____________________________ Darrick MeigsSangeeta Adon Gehlhausen, MD sp:bjt D: 04/02/2012 14:48:42 ET T: 04/03/2012 11:46:42 ET JOB#: [id number]  cc: Darrick MeigsSangeeta Keniah Klemmer, MD, <Dictator> Darrick MeigsSANGEETA Ashton Sabine MD ELECTRONICALLY SIGNED 04/04/2012 7:08

## 2015-01-25 NOTE — Consult Note (Signed)
PATIENT NAME:  Jeffery Dominguez, Jeffery Dominguez MR#:  [id number] DATE OF BIRTH:  1951/10/02  DATE OF CONSULTATION:  04/01/2012  REFERRING PHYSICIAN:   CONSULTING PHYSICIAN:  Muhammad A. Kirke CorinArida, MD  REASON FOR CONSULTATION: Atrial fibrillation.   HISTORY OF PRESENT ILLNESS: This is a 64 year old Caucasian man with past medical history of alcohol abuse, tobacco use, and paroxysmal atrial fibrillation. He lives in [place] and follows up with [facility]. He came to the ED for alcohol detox treatment. He was found to be in atrial fibrillation with rapid ventricular response. According to him, he goes into atrial fibrillation whenever he is under significant stress or during alcohol intoxication. He goes back into sinus rhythm when these issues resolve. He reports no previous history of hypertension, diabetes, stroke or heart failure. He does complain of mild dyspnea and occasional chest discomfort.   PAST MEDICAL HISTORY:  1. Paroxysmal atrial fibrillation.  2. Alcohol abuse.  3. Tobacco use.   SOCIAL HISTORY: He has smoked about 1/2 pack per day for about 20 years. He drinks excessive amounts of alcohol daily, over the last 20 years, with multiple relapses. 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  FAMILY HISTORY: Negative for premature coronary artery disease. There is family history of alcoholism.   HOME MEDICATIONS:  1. Vistaril 25 mg at bedtime as needed.  2. Prilosec 20 mg daily.  3. Suboxone 8 mg sublingual twice daily.   ALLERGIES: No known drug allergies.   REVIEW OF SYSTEMS: A 10 point review of systems was performed. It is negative other than what is mentioned in the history of present illness.   PHYSICAL EXAMINATION:   GENERAL: The patient appears to be at his stated age, in no acute distress.   VITAL SIGNS: Temperature 98.1, pulse 95, respiratory rate 18, blood pressure 107/63, and oxygen saturation 96% on 2 liters nasal cannula.   HEENT: Normocephalic, atraumatic.    NECK: No jugular venous distention or carotid bruits.   RESPIRATORY: Normal respiratory effort with no use of accessory muscles. Auscultation reveals normal breath sounds.   CARDIOVASCULAR: Normal PMI. Irregularly irregular with no gallops or murmurs.   ABDOMEN: Benign, nontender, and nondistended.   EXTREMITIES: No clubbing, cyanosis, or edema.   SKIN: Warm and dry with no rash.   PSYCHIATRIC: He is alert and oriented x3 with normal mood and affect.   LABORATORY AND DIAGNOSTIC DATA: His urine tox screen was negative. Renal function was normal. Alcohol level was elevated at 0.299.   ECG showed atrial fibrillation.   IMPRESSION:  1. Paroxysmal atrial fibrillation.  2. Alcohol abuse.  3. Tobacco abuse.   RECOMMENDATIONS: The patient has known history of paroxysmal atrial fibrillation which seems to be triggered by excessive alcohol use or significant stress and anxiety. His heart rate is still not well controlled. I recommend changing Cardizem to metoprolol. Beta blockers sometimes work better in situations where atrial fibrillation is mostly triggered by hyperadrenergic state. Given his borderline low blood pressure, he was also started on digoxin. He was given one dose of IV digoxin, 0.5 mg. He will be started on oral digoxin tomorrow, 0.25 mg daily. An echocardiogram has been completed and will be reviewed. Regarding his risk of stroke, his CHADS score is only zero. Thus, I do not think he will need long-term oral anticoagulation especially with his known history of excessive alcohol use. However, I will leave him here on Lovenox in case cardioversion is needed due to inability to control his rate.  Otherwise, the plan will be to control his rate with metoprolol and digoxin and place him on small dose aspirin.  ____________________________ Chelsea Aus. Kirke Corin, MD maa:slb D: 04/01/2012 12:42:54 ET T: 04/01/2012 13:18:06 ET JOB#: [id number]  cc: Jerolyn Center A. Kirke Corin, MD, <Dictator> Iran Ouch MD ELECTRONICALLY SIGNED 05/02/2012 14:39

## 2015-01-25 NOTE — H&P (Signed)
PATIENT NAME:  Tomasa HoseHART, Safi MR#:  [id number] DATE OF BIRTH:  1951-06-11  DATE OF ADMISSION:  03/31/2012  PRIMARY CARE PHYSICIAN:  Nonlocal.   REFERRING PHYSICIAN: Dr. Daphine DeutscherMartin.   CHIEF COMPLAINT: Detox.   HISTORY OF PRESENT ILLNESS: The patient is a 64 year old Caucasian male with a history of alcohol abuse, smoking, history of atrial fib, presented to the ED for detox. The patient said he has been drinking alcohol for 20 years and got a detox two weeks ago but drank more than before after discharge from behavioral medicine two weeks ago. He has had nausea, vomiting, headache and tremor. In addition, he said he had a seizure last night. He said he had a seizure before due to alcohol. The patient was found to have atrial fib with heart rate 139 in the ED and Dr. Daphine DeutscherMartin requests admission to medicine.   SOCIAL HISTORY: The patient has been smoking half pack a day for 20 years. Drank alcohol every day for 20 years. History of drug abuse but now Suboxone.   FAMILY HISTORY: No family history of hypertension, diabetes, stroke or heart attack. No cancer.  HOME MEDICATIONS:  1. Vistaril 25 mg p.o. one or two at bedtime p.r.n. for sleep. 2. Suboxone 8 mg/2 mg sublingual twice a day at 9:00 a.m. and 5:00 p.m.  3. Prilosec 20 mg p.o. daily.   ALLERGIES: None.   REVIEW OF SYSTEMS: CONSTITUTIONAL: The patient denies any fever or chills but has a headache, dizziness and weakness. EYES: No double vision or blurred vision. ENT: No epistaxis, postnasal drip or slurred speech. CARDIOVASCULAR: No chest pain, palpitations, but has palpitations. No orthopnea, nocturnal dyspnea or leg edema. PULMONARY: No cough, sputum, shortness of breath, or hemoptysis. GASTROINTESTINAL: No abdominal pain, but has nausea, vomiting, and mild diarrhea. GENITOURINARY: No dysuria, hematuria or incontinence. SKIN: No rash or jaundice. NEUROLOGY: No syncope, loss of consciousness, but had seizure last night.   PHYSICAL EXAMINATION:  VITAL  SIGNS: Temperature 98.2, blood pressure 110/73, pulse 93, respirations 20, oxygen saturation 95% on room air.   GENERAL: The patient is alert, awake, oriented, in no acute distress.   HEENT: Pupils round, equal, and reactive to light and accommodation.   NECK: Supple. No JVD or carotid bruits. No lymphadenopathy. No thyromegaly. Dry oral mucosa. Clear oropharynx.   CARDIOVASCULAR: S1, S2. Irregular rate and rhythm. No murmur or gallop.   PULMONARY: Bilateral air entry. No wheezing or rales.   ABDOMEN: Soft. No distention or tenderness. No organomegaly. Bowel sounds present.   EXTREMITIES: No edema, clubbing, or cyanosis. No calf tenderness. Strong bilateral pedal pulses.   SKIN: No rash or jaundice.   NEUROLOGY: Alert and oriented x3. No focal deficits. Sensation intact. Power 5/5. Deep tendon reflexes 2+.   LABORATORY, RADIOLOGICAL AND DIAGNOSTIC DATA: WBC 11.9, hemoglobin 17.2, platelets 249, glucose 103, BUN 19, creatinine 1.02. Sodium 138, potassium 3.8, bicarbonate 19, chloride 102, alcohol level 299 and TSH 1.43. Urinalysis negative. Urine toxicology is negative. EKG shows atrial fib at 139 beats per minute.   IMPRESSION:  1. Atrial fib with RVR.  2. Alcohol abuse.  3. Tobacco abuse.  4. Abnormal liver function tests.  5. Metabolic acidosis.   PLAN OF TREATMENT:  1. The patient will be admitted to the telemetry floor. We will continue telemetry monitor.  2. We will give Lopressor low-dose for control heart rate and start Lovenox 1 mg/kg q.12 hours subcutaneous.  3. Will get echocardiogram to evaluate heart function and get a cardiology consult for  atrial fib.  4. For alcohol abuse, we will start CIWA protocol and IV fluid support.  5. For tobacco abuse, smoking cessation counseled.  6. For alcohol abuse, alcohol cessation counseled.  7. Leukocytosis. Mild leukocytosis is possibly due to reaction.  8. Gastrointestinal prophylaxis.  9. Discussed the patient's situation and  plan of treatment with the patient. The patient may be transferred to behavioral medicine for detox after heart rate is controlled and medically is stable.   TIME SPENT: About 60 minutes.  ____________________________ Shaune Pollack, MD qc:ap D: 03/31/2012 15:57:04 ET T: 04/01/2012 07:33:55 ET JOB#: [id number]  cc: Shaune Pollack, MD, <Dictator> Shaune Pollack MD ELECTRONICALLY SIGNED 04/01/2012 15:53

## 2015-04-14 ENCOUNTER — Emergency Department
Admission: EM | Admit: 2015-04-14 | Discharge: 2015-04-14 | Discharge status: Against Medical Advice | Payer: Managed Care, Other (non HMO) | Attending: Emergency Medicine | Admitting: Emergency Medicine

## 2015-04-14 ENCOUNTER — Encounter: Payer: Self-pay | Admitting: *Deleted

## 2015-04-14 DIAGNOSIS — F1010 Alcohol abuse, uncomplicated: Secondary | ICD-10-CM | POA: Diagnosis present

## 2015-04-14 DIAGNOSIS — Z720 Tobacco use: Secondary | ICD-10-CM | POA: Diagnosis not present

## 2015-04-14 NOTE — ED Notes (Signed)
Pt requesting alcohol detox.  Pt last drank 3-4 hours ago.  Pt denies drug use.  Denies SI or HI.  Pt calm and cooperative.

## 2015-04-14 NOTE — ED Notes (Signed)
This RN was attempting to assess pt but pt was very irritated about having to answer questions. After I introduced myself I advised pt I was going to ask him a few questions to which he replied, "Do I really have to do this again!". Pt had only spoken to triage nurse briefly prior to this RN. Advised pt that I did need to ask him some questions to appropriately assess him. Pt admitted to drinking approximately a fifth of Vodka per day for the past 4 years. Pt then stated he had 2 "seizures" where he shakes in the past 3 days. Pt was asked the cause of the seizures and states he had tried to stop drinking at home but was unsuccessful. I then asked the pt how much and when he last had a drink. Pt would not give a direct answer. Pt was asked again for at least an estimate of how much he had to drink tonight/ today and when. Pt still would not give direct answer just stated he was "sipping on some on the way here when his friends were bringing me here". Pt then begins to yell at this RN and states "Does it really make a difference!" and begins to become belligerent. Pt advised by this RN that he would not yell at me, he needed to calm down, and yes I did need him to answer my questions to properly care for him. Pt continues to raise his voice and states are you going to do something for me or not. Pt then advised we are trying to help him and beh med intake would be in with him shortly to speak with him and attempt to find placement for him as we do not do alcohol detox here. Pt then gathered his backpack and proceeded to leave. Pt was followed to lobby by ODS officer. Flow coord. Madolyn FriezeLea F. RN aware of events.

## 2020-04-03 ENCOUNTER — Ambulatory Visit: Payer: Self-pay

## 2020-04-03 NOTE — Telephone Encounter (Signed)
Pt. Reports he has started "drinking again. Drinking 1 liter or more a day of alcohol." States he is concerned because his abdomen is"very swollen and I need to get into a detox program". No PCP. Instructed pt. To go to ED. Verbalizes understanding.  Reason for Disposition  Patient sounds very sick or weak to the triager  Answer Assessment - Initial Assessment Questions 1. DRUG: "What drug(s) are you using?"      Alcohol 2. ROUTE: "How are you using this drug?" (e.g., swallow, smoke, inject, huff, snort)     Drinking 3. HOW OFTEN: "How many days per week do you typically use it?"     I liter a day 4. HOW MUCH: "How much do you use?"      I liter day 5. LAST 24 HOURS: "Have you used it in the last 24 hours?"     Yes 6. ALCOHOL USE PROBLEM: "Do you have or have you ever had a problem with drinking too much alcohol?"     Yes 7. SYMPTOMS: "What symptoms are you currently experiencing?" (e.g., none, headache, chest pain, abdominal pain, vomiting)     Abdominal swelling 8. DETOX PROGRAM: "Have you ever gone through a substance use treatment program (detox program)?"     Yes 9. THERAPIST: "Do you have a counselor or therapist? Name?"     No 10. SUPPORT: "Who is with you now?" "Who do you live with?" "Do you have family or friends who you can talk to?"        Friend 11. PREGNANCY: "Is there any chance you are pregnant?"       N/A  Protocols used: SUBSTANCE USE AND PROBLEMS-A-AH

## 2020-04-10 ENCOUNTER — Other Ambulatory Visit: Payer: Self-pay

## 2020-04-10 DIAGNOSIS — F1020 Alcohol dependence, uncomplicated: Secondary | ICD-10-CM | POA: Insufficient documentation

## 2020-04-10 DIAGNOSIS — Z20822 Contact with and (suspected) exposure to covid-19: Secondary | ICD-10-CM | POA: Insufficient documentation

## 2020-04-10 DIAGNOSIS — F1410 Cocaine abuse, uncomplicated: Secondary | ICD-10-CM | POA: Insufficient documentation

## 2020-04-10 DIAGNOSIS — E872 Acidosis: Secondary | ICD-10-CM | POA: Insufficient documentation

## 2020-04-10 DIAGNOSIS — R1011 Right upper quadrant pain: Secondary | ICD-10-CM | POA: Diagnosis present

## 2020-04-10 DIAGNOSIS — F17200 Nicotine dependence, unspecified, uncomplicated: Secondary | ICD-10-CM | POA: Insufficient documentation

## 2020-04-10 LAB — COMPREHENSIVE METABOLIC PANEL
ALT: 71 U/L — ABNORMAL HIGH (ref 0–44)
AST: 250 U/L — ABNORMAL HIGH (ref 15–41)
Albumin: 3.8 g/dL (ref 3.5–5.0)
Alkaline Phosphatase: 152 U/L — ABNORMAL HIGH (ref 38–126)
Anion gap: 16 — ABNORMAL HIGH (ref 5–15)
BUN: <5 mg/dL — ABNORMAL LOW (ref 8–23)
CO2: 17 mmol/L — ABNORMAL LOW (ref 22–32)
Calcium: 9.4 mg/dL (ref 8.9–10.3)
Chloride: 97 mmol/L — ABNORMAL LOW (ref 98–111)
Creatinine, Ser: 1.01 mg/dL (ref 0.61–1.24)
GFR calc Af Amer: >60 mL/min (ref >60)
GFR calc non Af Amer: >60 mL/min (ref >60)
Glucose, Bld: 190 mg/dL — ABNORMAL HIGH (ref 70–99)
Potassium: 3.3 mmol/L — ABNORMAL LOW (ref 3.5–5.1)
Sodium: 130 mmol/L — ABNORMAL LOW (ref 135–145)
Total Bilirubin: 2.7 mg/dL — ABNORMAL HIGH (ref 0.3–1.2)
Total Protein: 7.7 g/dL (ref 6.5–8.1)

## 2020-04-10 LAB — CBC
HCT: 40.1 % (ref 39.0–52.0)
Hemoglobin: 13.9 g/dL (ref 13.0–17.0)
MCH: 34.7 pg — ABNORMAL HIGH (ref 26.0–34.0)
MCHC: 34.7 g/dL (ref 30.0–36.0)
MCV: 100 fL (ref 80.0–100.0)
Platelets: 226 10*3/uL (ref 150–400)
RBC: 4.01 MIL/uL — ABNORMAL LOW (ref 4.22–5.81)
RDW: 15.3 % (ref 11.5–15.5)
WBC: 5.4 10*3/uL (ref 4.0–10.5)
nRBC: 0 % (ref 0.0–0.2)

## 2020-04-10 LAB — LIPASE, BLOOD: Lipase: 26 U/L (ref 11–51)

## 2020-04-10 NOTE — ED Triage Notes (Signed)
Patient c/o upper abdominal pain and swelling. Patient reports heavy drinking; patient reports majority of a 1.65L vodka daily. Patient reports last drink at 1515 today. Patient reports hx of seizures with alcohol withdrawal.

## 2020-04-11 ENCOUNTER — Emergency Department: Payer: Federal, State, Local not specified - PPO

## 2020-04-11 ENCOUNTER — Emergency Department
Admission: EM | Admit: 2020-04-11 | Discharge: 2020-04-11 | Disposition: A | Discharge status: Home / Self Care | Payer: Federal, State, Local not specified - PPO | Attending: Emergency Medicine | Admitting: Emergency Medicine

## 2020-04-11 DIAGNOSIS — F1020 Alcohol dependence, uncomplicated: Secondary | ICD-10-CM

## 2020-04-11 DIAGNOSIS — R109 Unspecified abdominal pain: Secondary | ICD-10-CM

## 2020-04-11 DIAGNOSIS — E8729 Other acidosis: Secondary | ICD-10-CM

## 2020-04-11 DIAGNOSIS — R1011 Right upper quadrant pain: Secondary | ICD-10-CM

## 2020-04-11 HISTORY — DX: Gastro-esophageal reflux disease without esophagitis: K21.9

## 2020-04-11 HISTORY — DX: Unspecified convulsions: R56.9

## 2020-04-11 LAB — URINALYSIS, COMPLETE (UACMP) WITH MICROSCOPIC
Bacteria, UA: NONE SEEN
Bilirubin Urine: NEGATIVE
Glucose, UA: NEGATIVE mg/dL
Hgb urine dipstick: NEGATIVE
Ketones, ur: NEGATIVE mg/dL
Leukocytes,Ua: NEGATIVE
Nitrite: NEGATIVE
Protein, ur: NEGATIVE mg/dL
Specific Gravity, Urine: 1.01 (ref 1.005–1.030)
pH: 6 (ref 5.0–8.0)

## 2020-04-11 LAB — URINE DRUG SCREEN, QUALITATIVE (ARMC ONLY)
Amphetamines, Ur Screen: NOT DETECTED
Barbiturates, Ur Screen: NOT DETECTED
Benzodiazepine, Ur Scrn: NOT DETECTED
Cannabinoid 50 Ng, Ur ~~LOC~~: NOT DETECTED
Cocaine Metabolite,Ur ~~LOC~~: POSITIVE — AB
MDMA (Ecstasy)Ur Screen: NOT DETECTED
Methadone Scn, Ur: NOT DETECTED
Opiate, Ur Screen: NOT DETECTED
Phencyclidine (PCP) Ur S: NOT DETECTED
Tricyclic, Ur Screen: NOT DETECTED

## 2020-04-11 LAB — COMPREHENSIVE METABOLIC PANEL
ALT: 58 U/L — ABNORMAL HIGH (ref 0–44)
AST: 206 U/L — ABNORMAL HIGH (ref 15–41)
Albumin: 3.2 g/dL — ABNORMAL LOW (ref 3.5–5.0)
Alkaline Phosphatase: 144 U/L — ABNORMAL HIGH (ref 38–126)
Anion gap: 10 (ref 5–15)
BUN: 5 mg/dL — ABNORMAL LOW (ref 8–23)
CO2: 25 mmol/L (ref 22–32)
Calcium: 8.5 mg/dL — ABNORMAL LOW (ref 8.9–10.3)
Chloride: 99 mmol/L (ref 98–111)
Creatinine, Ser: 0.92 mg/dL (ref 0.61–1.24)
GFR calc Af Amer: >60 mL/min (ref >60)
GFR calc non Af Amer: >60 mL/min (ref >60)
Glucose, Bld: 172 mg/dL — ABNORMAL HIGH (ref 70–99)
Potassium: 3.3 mmol/L — ABNORMAL LOW (ref 3.5–5.1)
Sodium: 134 mmol/L — ABNORMAL LOW (ref 135–145)
Total Bilirubin: 2.3 mg/dL — ABNORMAL HIGH (ref 0.3–1.2)
Total Protein: 6.4 g/dL — ABNORMAL LOW (ref 6.5–8.1)

## 2020-04-11 LAB — ETHANOL: Alcohol, Ethyl (B): <10 mg/dL (ref <10)

## 2020-04-11 LAB — TROPONIN I (HIGH SENSITIVITY)
Troponin I (High Sensitivity): 13 ng/L (ref <18)
Troponin I (High Sensitivity): 13 ng/L (ref <18)

## 2020-04-11 LAB — SARS CORONAVIRUS 2 BY RT PCR (HOSPITAL ORDER, PERFORMED IN ~~LOC~~ HOSPITAL LAB): SARS Coronavirus 2: NEGATIVE

## 2020-04-11 MED ORDER — CHLORDIAZEPOXIDE HCL 25 MG PO CAPS
50.0000 mg | ORAL_CAPSULE | Freq: Once | ORAL | Status: AC
  Administered 2020-04-11: 50 mg via ORAL
  Filled 2020-04-11: qty 2

## 2020-04-11 MED ORDER — LORAZEPAM 2 MG PO TABS
0.0000 mg | ORAL_TABLET | Freq: Four times a day (QID) | ORAL | Status: DC
  Administered 2020-04-11: 1 mg via ORAL
  Filled 2020-04-11: qty 1

## 2020-04-11 MED ORDER — ONDANSETRON HCL 4 MG/2ML IJ SOLN
4.0000 mg | Freq: Once | INTRAMUSCULAR | Status: AC
  Administered 2020-04-11: 4 mg via INTRAVENOUS
  Filled 2020-04-11: qty 2

## 2020-04-11 MED ORDER — LORAZEPAM 2 MG/ML IJ SOLN: 0.0000 mg | Freq: Four times a day (QID) | INTRAMUSCULAR | Status: DC

## 2020-04-11 MED ORDER — LACTATED RINGERS IV BOLUS
1000.0000 mL | Freq: Once | INTRAVENOUS | Status: AC
  Administered 2020-04-11: 1000 mL via INTRAVENOUS

## 2020-04-11 MED ORDER — LORAZEPAM 2 MG PO TABS: 0.0000 mg | ORAL_TABLET | Freq: Two times a day (BID) | ORAL | Status: DC

## 2020-04-11 MED ORDER — THIAMINE HCL 100 MG/ML IJ SOLN: 100.0000 mg | Freq: Every day | INTRAMUSCULAR | Status: DC

## 2020-04-11 MED ORDER — IOHEXOL 300 MG/ML  SOLN
100.0000 mL | Freq: Once | INTRAMUSCULAR | Status: AC | PRN
  Administered 2020-04-11: 100 mL via INTRAVENOUS

## 2020-04-11 MED ORDER — GADOBUTROL 1 MMOL/ML IV SOLN
7.5000 mL | Freq: Once | INTRAVENOUS | Status: AC | PRN
  Administered 2020-04-11: 7.5 mL via INTRAVENOUS

## 2020-04-11 MED ORDER — THIAMINE HCL 100 MG PO TABS
100.0000 mg | ORAL_TABLET | Freq: Every day | ORAL | Status: DC
  Administered 2020-04-11: 100 mg via ORAL
  Filled 2020-04-11: qty 1

## 2020-04-11 MED ORDER — CHLORDIAZEPOXIDE HCL 25 MG PO CAPS: ORAL_CAPSULE | ORAL | 0 refills | Status: AC

## 2020-04-11 MED ORDER — LORAZEPAM 2 MG/ML IJ SOLN: 0.0000 mg | Freq: Two times a day (BID) | INTRAMUSCULAR | Status: DC

## 2020-04-11 NOTE — ED Notes (Signed)
Patient transported to MRI at this time. 

## 2020-04-11 NOTE — ED Notes (Signed)
Reviewed discharge instructions, follow-up care, and prescriptions with patient. Patient verbalized understanding of all information reviewed. Patient stable, with no distress noted at this time.    

## 2020-04-11 NOTE — ED Provider Notes (Signed)
----------------------------------------- °  11:28 AM on 04/11/2020 -----------------------------------------  Patient's MRI is negative for acute abnormality does show several chronic findings.  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  Patient agreeable plan of care.  Continues to wish for TTS for detox placement.   Minna Antis, MD 04/11/20 [id number]

## 2020-04-11 NOTE — ED Notes (Signed)
Pt provided mea tray and drink. Ok per EDP.

## 2020-04-11 NOTE — ED Provider Notes (Signed)
[facility] Emergency Department Provider Note  ____________________________________________  Time seen: Approximately 12:39 AM  I have reviewed the triage vital signs and the nursing notes.   HISTORY  Chief Complaint Abdominal Pain   HPI Jeffery Dominguez is a 69 y.o. male with a history of seizures, alcohol abuse with complicated withdrawals, cocaine abuse, A. Fib not on anticoagulation, pancreatic duct stricture status post stent in 2020  who presents for abdominal pain and detox.  Patient reports that he last detoxed in the end of June at the [place].  He was hospitalized for 6 days due to complications from alcohol withdrawal seizures.  After being discharged home patient started having vivid nightmares which led him to go back to drinking.  He drinks about 1.5 L of vodka a day.  Also uses cocaine intermittently.  Last drink was today at 3 PM.  Last cocaine use was today.  Patient reports that he went to the [place] couple days ago to ask for detox but was sent home.  He also is complaining of a week of progressively worsening abdominal pain and distention.  Describes the pain as a ache located in the epigastric and right upper quadrant, constant and nonradiating.  He is having bowel movements and passing flatus.  No vomiting, no constipation or diarrhea.  No prior abdominal surgeries however patient did have inguinal hernia surgery.  Past Medical History:  Diagnosis Date  . GERD (gastroesophageal reflux disease)   . Seizures (HCC)     Past Surgical History:  Procedure Laterality Date  . HERNIA REPAIR      Prior to Admission medications   Medication Sig Start Date End Date Taking? Authorizing Provider  folic acid (FOLVITE) 1 MG tablet Take 1 mg by mouth daily. 02/06/20  Yes [provider]  hydrOXYzine (ATARAX/VISTARIL) 25 MG tablet Take 25 mg by mouth 3 (three) times daily as needed. 12/31/19  Yes [provider]  SUBOXONE 8-2 MG FILM Place 1 Film under  the tongue daily. 12/31/19  Yes [provider]  thiamine 100 MG tablet Take 100 mg by mouth daily.   Yes [provider]    Allergies Patient has no known allergies.  No family history on file.  Social History Social History   Tobacco Use  . Smoking status: Current Every Day Smoker  . Smokeless tobacco: Never Used  Substance Use Topics  . Alcohol use: Yes  . Drug use: Not on file    Review of Systems  Constitutional: Negative for fever. Eyes: Negative for visual changes. ENT: Negative for sore throat. Neck: No neck pain  Cardiovascular: Negative for chest pain. Respiratory: Negative for shortness of breath. Gastrointestinal: + abdominal pain. No vomiting or diarrhea. Genitourinary: Negative for dysuria. Musculoskeletal: Negative for back pain. Skin: Negative for rash. Neurological: Negative for headaches, weakness or numbness. Psych: No SI or HI  ____________________________________________   PHYSICAL EXAM:  VITAL SIGNS: ED Triage Vitals [04/10/20 2022]  Enc Vitals Group     BP (!) 151/104     Pulse Rate (!) 106     Resp 18     Temp 98.3 F (36.8 C)     Temp src      SpO2 95 %     Weight 205 lb (93 kg)     Height 6\' 2"  (1.88 m)     Head Circumference      Peak Flow      Pain Score 6     Pain Loc  Pain Edu?      Excl. in GC?     Constitutional: Alert and oriented. Well appearing and in no apparent distress. HEENT:      Head: Normocephalic and atraumatic.         Eyes: Conjunctivae are normal. Sclera is non-icteric.       Mouth/Throat: Mucous membranes are moist.       Neck: Supple with no signs of meningismus. Cardiovascular: Regular rate and rhythm. No murmurs, gallops, or rubs.  Respiratory: Normal respiratory effort. Lungs are clear to auscultation bilaterally. No wheezes, crackles, or rhonchi.  Gastrointestinal: Soft, distended with RUQ and epigastric tenderness to palpation, and non distended with positive bowel sounds. No  rebound or guarding. Genitourinary: No CVA tenderness. Musculoskeletal: No edema, cyanosis, or erythema of extremities. Neurologic: Normal speech and language. Face is symmetric. Moving all extremities. No gross focal neurologic deficits are appreciated. Skin: Skin is warm, dry and intact. No rash noted. Psychiatric: Mood and affect are normal. Speech and behavior are normal.  ____________________________________________   LABS (all labs ordered are listed, but only abnormal results are displayed)  Labs Reviewed  COMPREHENSIVE METABOLIC PANEL - Abnormal; Notable for the following components:      Result Value   Sodium 130 (*)    Potassium 3.3 (*)    Chloride 97 (*)    CO2 17 (*)    Glucose, Bld 190 (*)    BUN <5 (*)    AST 250 (*)    ALT 71 (*)    Alkaline Phosphatase 152 (*)    Total Bilirubin 2.7 (*)    Anion gap 16 (*)    All other components within normal limits  CBC - Abnormal; Notable for the following components:   RBC 4.01 (*)    MCH 34.7 (*)    All other components within normal limits  URINALYSIS, COMPLETE (UACMP) WITH MICROSCOPIC - Abnormal; Notable for the following components:   Color, Urine AMBER (*)    APPearance CLEAR (*)    All other components within normal limits  URINE DRUG SCREEN, QUALITATIVE ([facility] ONLY) - Abnormal; Notable for the following components:   Cocaine Metabolite,Ur [place] POSITIVE (*)    All other components within normal limits  COMPREHENSIVE METABOLIC PANEL - Abnormal; Notable for the following components:   Sodium 134 (*)    Potassium 3.3 (*)    Glucose, Bld 172 (*)    BUN 5 (*)    Calcium 8.5 (*)    Total Protein 6.4 (*)    Albumin 3.2 (*)    AST 206 (*)    ALT 58 (*)    Alkaline Phosphatase 144 (*)    Total Bilirubin 2.3 (*)    All other components within normal limits  SARS CORONAVIRUS 2 BY RT PCR (HOSPITAL ORDER, PERFORMED IN [facility])  LIPASE, BLOOD  ETHANOL  TROPONIN I (HIGH SENSITIVITY)  TROPONIN I (HIGH  SENSITIVITY)   ____________________________________________  EKG  ED ECG REPORT I, Nita Sicklearolina Kalik Hoare, the attending physician, personally viewed and interpreted this ECG.  Normal sinus rhythm, rate of 97, normal intervals, normal axis, no ST elevations, mild ST depressions on lateral leads and inferior leads which are new when compared to prior from 2013 ____________________________________________  RADIOLOGY  I have personally reviewed the images performed during this visit and I agree with the Radiologist's read.   Interpretation by Radiologist:  CT ABDOMEN PELVIS W CONTRAST  Result Date: 04/11/2020 CLINICAL DATA:  Abdominal pain and swelling, heavy drinking  EXAM: CT ABDOMEN AND PELVIS WITH CONTRAST TECHNIQUE: Multidetector CT imaging of the abdomen and pelvis was performed using the standard protocol following bolus administration of intravenous contrast. CONTRAST:  OMNIPAQUE IOHEXOL 300 MG/ML  SOLN COMPARISON:  CT 03/12/2019 FINDINGS: Lower chest: Lung bases are clear. Normal heart size. No pericardial effusion. Coronary artery calcifications are present. Hepatobiliary: Marked hepatic hypoattenuation compatible with hepatic steatosis. Appearance of the liver suggesting prior hepatic resection, unchanged since 2017. Diffuse hepatomegaly and caudate lobe hypertrophy is increased from priors. Question a micronodular surface contour. Circumferential thickening of the gallbladder wall with mucosal hyperemia. No visible calcified gallstones. Extrahepatic biliary ductal dilatation to 11 mm is not significantly changed from prior exam. No visible calcified gallstones. Mild compression of the intrahepatic IVC, possibly related to the hypertrophic changes versus hypovolemia. Appearance is similar to prior. Pancreas: Faint peripancreatic stranding is noted predominately along the pancreatic body and tail. No discernible pancreatic lesions or ductal dilatation. Spleen: Splenomegaly with several  calcified granulomata. No concerning splenic lesion. Adrenals/Urinary Tract: Normal adrenal glands. Kidneys enhance and excrete symmetrically. No worrisome renal lesions. No urolithiasis or hydronephrosis. Urinary bladder is unremarkable. Stomach/Bowel: Prominent rugal folds of the gastric body with relative sparing of the antrum. No small bowel thickening or dilatation. A thickened, fluid-filled appendix with several tiny appendiceal diverticula are similar to prior. Intramural fat within the cecum may reflect sequela of prior inflammation. Mild pancolonic mural thickening is noted. Vascular/Lymphatic: Upper abdominal venous collateralization is noted. Reproductive: The prostate and seminal vesicles are unremarkable. Other: No abdominopelvic free fluid or free gas. No bowel containing hernias. Small fat containing bilateral inguinal hernias. Musculoskeletal: No acute osseous abnormality or suspicious osseous lesion. Multilevel degenerative changes are present in the imaged portions of the spine. IMPRESSION: 1. Marked hepatic hypoattenuation compatible with hepatic steatosis. Appearance of the liver suggesting prior hepatic resection, unchanged since 2017. Some apparent micro nodularity of the liver surface contour could reflect developing cirrhosis. Additional features suggesting some early portal venous hypertension are present as well including upper abdominal venous collateralization and splenomegaly. 2. Circumferential thickening of the gallbladder wall with mucosal hyperemia. Nonspecific in the setting of intrinsic liver disease though could reflect an early or developing cholecystitis. 3. Faint peripancreatic stranding is noted predominately along the pancreatic body and tail, correlate with lipase. 4. Prominent rugal folds of the gastric body with relative sparing of the antrum, suggestive of gastritis, portal enteropathy, or Menetrier's disease. 5. Mild pancolonic mural thickening, nonspecific, but can be  seen with colitis could be seen with portal colopathy. 6. 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 Appendiceal diverticulosis may warrant prophylactic appendectomy given high risk of perforation and association with neoplasia. 7. Aortic Atherosclerosis (ICD10-I70.0). Electronically Signed   By: Kreg Shropshire M.D.   On: 04/11/2020 04:03     ____________________________________________   PROCEDURES  Procedure(s) performed:yes .1-3 Lead EKG Interpretation Performed by: Nita Sickle, MD Authorized by: Nita Sickle, MD     Interpretation: non-specific     ECG rate assessment: normal     Rhythm: sinus rhythm     Ectopy: none     Critical Care performed:  None ____________________________________________   INITIAL IMPRESSION / ASSESSMENT AND PLAN / ED COURSE  69 y.o. male with a history of seizures, alcohol abuse with complicated withdrawals, cocaine abuse, A. Fib not on anticoagulation, pancreatic duct stricture status post stent in 2020  who presents for abdominal pain and detox.   Patient is well-appearing with no signs of withdrawal at this time.  Abdomen is distended with tenderness palpation epigastric and right upper quadrant regions.  Differential diagnoses including occlusion of the pancreatic stent to versus cholecystitis versus cholelithiasis versus hepatitis versus cirrhosis of the liver versus ascites versus SBP versus pancreatitis versus peptic ulcer disease versus alcoholic gastritis.  We will consult TTS to help for inpatient placement for detox since patient has a history of complicated withdrawals.  Will start patient on Librium at this time.  No signs of withdrawal at this time.  Labs showing normal lipase but does show elevation of LFTs and T bili which could be consistent with cirrhosis but also possibly an occlusion of the pancreatic stent.  His white count is normal.  Patient also has signs of alcoholic ketoacidosis with an  anion gap of 16, bicarb of 17, glucose of 190.  Will give IV fluid bolus.  Alcohol level is pending.  Drug screen is pending.  Patient is EKG shows new ST depressions in lateral and inferior leads with no ST elevation.  Patient is not having any chest pain or shortness of breath.  Patient does not have a recent EKG for comparison.  We will cycle his cardiac enzymes.  _________________________ 7:16 AM on 04/11/2020 -----------------------------------------  CT with questionable cholecystitis/pancreatitis, confirmed by radiology.  Discussed with Dr. Tonna Boehringer from surgery who recommended MRCP.  If that is positive patient to be admitted for cholecystectomy.  If that is negative patient is medically cleared for inpatient placement for detox.  He remains with a CIWA of 0.  Repeat CMP showing closing of anion gap.  We will continue to monitor closely for any signs of complicated withdrawal. Care transferred to Dr. Lenard Lance     _____________________________________________ Please note:  Patient was evaluated in Emergency Department today for the symptoms described in the history of present illness. Patient was evaluated in the context of the global COVID-19 pandemic, which necessitated consideration that the patient might be at risk for infection with the SARS-CoV-2 virus that causes COVID-19. Institutional protocols and algorithms that pertain to the evaluation of patients at risk for COVID-19 are in a state of rapid change based on information released by regulatory bodies including the CDC and federal and state organizations. These policies and algorithms were followed during the patient's care in the ED.  Some ED evaluations and interventions may be delayed as a result of limited staffing during the pandemic.   [place] Controlled Substance Database was reviewed by me. ____________________________________________   FINAL CLINICAL IMPRESSION(S) / ED DIAGNOSES   Final diagnoses:  Uncomplicated alcohol  dependence (HCC)  Alcoholic ketoacidosis  Right upper quadrant abdominal pain      NEW MEDICATIONS STARTED DURING THIS VISIT:  ED Discharge Orders    None       Note:  This document was prepared using Dragon voice recognition software and may include unintentional dictation errors.    Don Perking, [place], MD 04/11/20 [phone]
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
## 2020-04-11 NOTE — ED Provider Notes (Signed)
-----------------------------------------   10:39 PM on 04/11/2020 -----------------------------------------  No inpatient detox beds currently available, patient currently awake and alert and ambulatory without difficulty.  No signs of acute withdrawal at this time and he is appropriate for discharge home.  He will be discharged home with a prescription for Librium as well as resources for substance abuse.   Chesley Noon, MD 04/11/20 [id number]

## 2020-04-11 NOTE — BH Assessment (Signed)
Referral information for Detox Treatment faxed to;   [facility] [phone] or [phone]) 9:07 am: Per Maureen Ralphs, this facility does not have bed availiability at this time.    [facility] [phone]) Pt denied due to having medicare as primary insurance.   [facility] hospital reports no current detox beds available

## 2020-04-11 NOTE — ED Notes (Signed)
IV team at bedside at this time. 

## 2020-04-11 NOTE — ED Notes (Signed)
Pt given cereal and milk.  

## 2020-04-11 NOTE — BH Assessment (Addendum)
Referral information for Detox Treatment faxed to;   . [facility] [phone] or [phone]) Call back after 9am  . [facility] [phone]) Call back after [facility] reports no current detox beds available

## 2020-04-11 NOTE — BH Assessment (Addendum)
Referral information forDetox Treatment faxed to;  [facility] [phone] Jacki Cones reports that beds are available in the morning after 9am on 04/12/20, call back to check   [facility] [phone])Pt denied due to having medicare as primary insurance.   [facility] hospital reports no current detox beds available

## 2020-04-11 NOTE — ED Notes (Signed)
2 unsuccessful IV attempts by this RN (right AC and left upper forearm). 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 Order for IV Team consult placed at this time.

## 2020-04-11 NOTE — ED Notes (Signed)
Dr. Paduchowski at bedside.  

## 2020-12-01 DEATH — deceased
